# Patient Record
Sex: Male | Born: 1985 | Race: Black or African American | Hispanic: No | Marital: Single | Smoking: Never smoker
Health system: Southern US, Community
[De-identification: ages and names within clinical notes are randomized; demographics above are authoritative.]

## PROBLEM LIST (undated history)

## (undated) DIAGNOSIS — J45909 Unspecified asthma, uncomplicated: Secondary | ICD-10-CM

---

## 2008-01-02 ENCOUNTER — Emergency Department: Payer: Self-pay | Admitting: Emergency Medicine

## 2008-01-03 ENCOUNTER — Emergency Department (HOSPITAL_COMMUNITY): Admission: EM | Admit: 2008-01-03 | Discharge: 2008-01-03 | Payer: Self-pay | Admitting: Emergency Medicine

## 2010-05-23 ENCOUNTER — Emergency Department: Payer: Self-pay | Admitting: Emergency Medicine

## 2011-03-05 ENCOUNTER — Emergency Department: Payer: Self-pay | Admitting: Emergency Medicine

## 2013-02-18 ENCOUNTER — Emergency Department: Payer: Self-pay | Admitting: Emergency Medicine

## 2015-12-09 ENCOUNTER — Emergency Department
Admission: EM | Admit: 2015-12-09 | Discharge: 2015-12-09 | Disposition: A | Discharge status: Home / Self Care | Payer: BLUE CROSS/BLUE SHIELD | Attending: Emergency Medicine | Admitting: Emergency Medicine

## 2015-12-09 ENCOUNTER — Encounter: Payer: Self-pay | Admitting: Emergency Medicine

## 2015-12-09 DIAGNOSIS — L209 Atopic dermatitis, unspecified: Secondary | ICD-10-CM | POA: Diagnosis not present

## 2015-12-09 DIAGNOSIS — R350 Frequency of micturition: Secondary | ICD-10-CM | POA: Diagnosis present

## 2015-12-09 LAB — BASIC METABOLIC PANEL
Anion gap: 6 (ref 5–15)
BUN: 16 mg/dL (ref 6–20)
CHLORIDE: 101 mmol/L (ref 101–111)
CO2: 29 mmol/L (ref 22–32)
CREATININE: 1.06 mg/dL (ref 0.61–1.24)
Calcium: 9.5 mg/dL (ref 8.9–10.3)
GFR calc Af Amer: >60 mL/min (ref >60)
GFR calc non Af Amer: >60 mL/min (ref >60)
GLUCOSE: 95 mg/dL (ref 65–99)
POTASSIUM: 4 mmol/L (ref 3.5–5.1)
SODIUM: 136 mmol/L (ref 135–145)

## 2015-12-09 LAB — URINALYSIS COMPLETE WITH MICROSCOPIC (ARMC ONLY)
BACTERIA UA: NONE SEEN
BILIRUBIN URINE: NEGATIVE
GLUCOSE, UA: NEGATIVE mg/dL
HGB URINE DIPSTICK: NEGATIVE
KETONES UR: NEGATIVE mg/dL
LEUKOCYTES UA: NEGATIVE
NITRITE: NEGATIVE
Protein, ur: NEGATIVE mg/dL
SPECIFIC GRAVITY, URINE: 1.023 (ref 1.005–1.030)
Squamous Epithelial / LPF: NONE SEEN
pH: 5 (ref 5.0–8.0)

## 2015-12-09 LAB — CHLAMYDIA/NGC RT PCR (ARMC ONLY)
Chlamydia Tr: NOT DETECTED
N gonorrhoeae: NOT DETECTED

## 2015-12-09 LAB — RAPID HIV SCREEN (HIV 1/2 AB+AG)
HIV 1/2 ANTIBODIES: NONREACTIVE
HIV-1 P24 ANTIGEN - HIV24: NONREACTIVE

## 2015-12-09 LAB — CBC
HEMATOCRIT: 47.1 % (ref 40.0–52.0)
Hemoglobin: 16 g/dL (ref 13.0–18.0)
MCH: 31.3 pg (ref 26.0–34.0)
MCHC: 33.9 g/dL (ref 32.0–36.0)
MCV: 92.2 fL (ref 80.0–100.0)
PLATELETS: 189 10*3/uL (ref 150–440)
RBC: 5.11 MIL/uL (ref 4.40–5.90)
RDW: 12.6 % (ref 11.5–14.5)
WBC: 6.8 10*3/uL (ref 3.8–10.6)

## 2015-12-09 MED ORDER — TRIAMCINOLONE ACETONIDE 0.5 % EX OINT: 1.0000 "application " | TOPICAL_OINTMENT | Freq: Two times a day (BID) | CUTANEOUS | 0 refills | Status: DC

## 2015-12-09 NOTE — ED Triage Notes (Signed)
Has been feeling tired lately  Urinary freq w/o pain  Decreased appetite   Also states he want to bee checked for STD   Penile discharge but has had some dry skin  And feeling pressure

## 2015-12-09 NOTE — ED Notes (Signed)
Says frequent urination, fatigue for about 2 weeks and has headache sometimes.  Says he wants to be checked for diabetes.  Also a dry sore area in genital area.

## 2015-12-09 NOTE — ED Notes (Signed)
Also says elbows hurting with extension for months.

## 2015-12-13 NOTE — ED Provider Notes (Signed)
[facility] Emergency Department Provider Note    First MD Initiated Contact with Patient 12/09/15 1249     (approximate)  I have reviewed the triage vital signs and the nursing notes.   HISTORY  Chief Complaint Fatigue and Urinary Frequency    HPI Sean Wise is a 30 y.o. male presents to the emergency department with urinary frequency and requesting to be checked for sexually transmitted diseases. Patient states that he's had 4 sexual partners to see her which were unprotected intercourse. Patient denies any penile discharge no fever no nausea or vomiting or abdominal pain. Patient stated that he's noted areas of dry skin on his glans penis.  Past medical history Eczema There are no active problems to display for this patient.   Past surgical history None  Prior to Admission medications   Medication Sig Start Date End Date Taking? Authorizing Provider  triamcinolone ointment (KENALOG) 0.5 % Apply 1 application topically 2 (two) times daily. 12/09/15   Darci Currentandolph N Lerae Langham, MD    Allergies Chocolate; Peanut-containing drug products; and Tomato  No family history on file.  Social History Social History  Substance Use Topics  . Smoking status: Never Smoker  . Smokeless tobacco: Never Used  . Alcohol use No    Review of Systems Constitutional: No fever/chills Eyes: No visual changes. ENT: No sore throat. Cardiovascular: Denies chest pain. Respiratory: Denies shortness of breath. Gastrointestinal: No abdominal pain.  No nausea, no vomiting.  No diarrhea.  No constipation. Genitourinary: Negative for dysuria.Positive for "dry skin on penis" Musculoskeletal: Negative for back pain. Skin: Negative for rash. Neurological: Negative for headaches, focal weakness or numbness.  10-point ROS otherwise negative.  ____________________________________________   PHYSICAL EXAM:  VITAL SIGNS: ED Triage Vitals  Enc Vitals Group     BP 12/09/15  1128 129/82     Pulse Rate 12/09/15 1128 71     Resp 12/09/15 1128 20     Temp 12/09/15 1128 98.4 F (36.9 C)     Temp Source 12/09/15 1128 Oral     SpO2 12/09/15 1128 97 %     Weight 12/09/15 1125 190 lb (86.2 kg)     Height 12/09/15 1125 5\' 6"  (1.676 m)     Head Circumference --      Peak Flow --      Pain Score --      Pain Loc --      Pain Edu? --      Excl. in GC? --     Constitutional: Alert and oriented. Well appearing and in no acute distress. Eyes: Conjunctivae are normal. PERRL. EOMI. Head: Atraumatic. Mouth/Throat: Mucous membranes are moist.  Oropharynx non-erythematous. Neck: No stridor.  No meningeal signs.   Cardiovascular: Normal rate, regular rhythm. Good peripheral circulation. Grossly normal heart sounds. Respiratory: Normal respiratory effort.  No retractions. Lungs CTAB. Gastrointestinal: Soft and nontender. No distention.  Genitourinary: Dry skin noted on the glans penis Musculoskeletal: No lower extremity tenderness nor edema. No gross deformities of extremities. Neurologic:  Normal speech and language. No gross focal neurologic deficits are appreciated.  Skin:  Bilateral antecubital rash consistent with eczema Psychiatric: Mood and affect are normal. Speech and behavior are normal.  ____________________________________________   LABS (all labs ordered are listed, but only abnormal results are displayed)  Labs Reviewed  URINALYSIS COMPLETEWITH MICROSCOPIC ([facility] ONLY) - Abnormal; Notable for the following:       Result Value   Color, Urine YELLOW (*)    APPearance  CLEAR (*)    All other components within normal limits  CHLAMYDIA/NGC RT PCR ([facility] ONLY)  BASIC METABOLIC PANEL  CBC  RAPID HIV SCREEN (HIV 1/2 AB+AG)   _______ Procedures     INITIAL IMPRESSION / ASSESSMENT AND PLAN / ED COURSE  Pertinent labs & imaging results that were available during my care of the patient were reviewed by me and considered in my medical decision  making (see chart for details).     Clinical Course     ____________________________________________  FINAL CLINICAL IMPRESSION(S) / ED DIAGNOSES  Final diagnoses:  Atopic dermatitis, unspecified type     MEDICATIONS GIVEN DURING THIS VISIT:  Medications - No data to display   NEW OUTPATIENT MEDICATIONS STARTED DURING THIS VISIT:  Discharge Medication List as of 12/09/2015  1:49 PM    START taking these medications   Details  triamcinolone ointment (KENALOG) 0.5 % Apply 1 application topically 2 (two) times daily., Starting Mon 12/09/2015, Print        Discharge Medication List as of 12/09/2015  1:49 PM      Discharge Medication List as of 12/09/2015  1:49 PM       Note:  This document was prepared using Dragon voice recognition software and may include unintentional dictation errors.    Darci Currentandolph N Katielynn Horan, MD 12/13/15 2249

## 2015-12-24 ENCOUNTER — Encounter: Payer: Self-pay | Admitting: Emergency Medicine

## 2015-12-24 ENCOUNTER — Emergency Department
Admission: EM | Admit: 2015-12-24 | Discharge: 2015-12-24 | Disposition: A | Discharge status: Home / Self Care | Payer: BLUE CROSS/BLUE SHIELD | Attending: Emergency Medicine | Admitting: Emergency Medicine

## 2015-12-24 DIAGNOSIS — R509 Fever, unspecified: Secondary | ICD-10-CM | POA: Diagnosis present

## 2015-12-24 DIAGNOSIS — Z91010 Allergy to peanuts: Secondary | ICD-10-CM | POA: Insufficient documentation

## 2015-12-24 DIAGNOSIS — J029 Acute pharyngitis, unspecified: Secondary | ICD-10-CM | POA: Insufficient documentation

## 2015-12-24 LAB — POCT RAPID STREP A: Streptococcus, Group A Screen (Direct): NEGATIVE

## 2015-12-24 MED ORDER — MAGIC MOUTHWASH W/LIDOCAINE: 5.0000 mL | Freq: Three times a day (TID) | ORAL | 0 refills | Status: DC | PRN

## 2015-12-24 NOTE — ED Provider Notes (Signed)
[facility] Emergency Department Provider Note  ____________________________________________  Time seen: Approximately 4:22 PM  I have reviewed the triage vital signs and the nursing notes.   HISTORY  Chief Complaint Sore Throat    HPI Sean Wise is a 30 y.o. male that presents to the emergency department with a sore throat for 2 days. Patient states that over the weekend he had some chills. Patient states that he has had a fever but not sure how high. Patient states today that he is having difficulty swallowing secondary to pain. Patient is eating and drinking normally. Patient has been taking ibuprofen for pain, which is helping. Pain does not feel like throat is closing and patient is not having difficulty breathing.   History reviewed. No pertinent past medical history.  There are no active problems to display for this patient.   History reviewed. No pertinent surgical history.  Prior to Admission medications   Medication Sig Start Date End Date Taking? Authorizing Provider  magic mouthwash w/lidocaine SOLN Take 5 mLs by mouth 3 (three) times daily as needed for mouth pain. 12/24/15   Enid DerryAshley Gizzelle Lacomb, PA-C  triamcinolone ointment (KENALOG) 0.5 % Apply 1 application topically 2 (two) times daily. 12/09/15   Darci Currentandolph N Brown, MD    Allergies Chocolate; Peanut-containing drug products; and Tomato  No family history on file.  Social History Social History  Substance Use Topics  . Smoking status: Never Smoker  . Smokeless tobacco: Never Used  . Alcohol use No     Review of Systems  Eyes: No visual changes. No discharge ENT: No upper respiratory complaints. Cardiovascular: no chest pain. Respiratory: no cough. No SOB. Gastrointestinal: No abdominal pain.  No nausea, no vomiting.  Musculoskeletal: Negative for musculoskeletal pain. Skin: Negative for rash, abrasions, lacerations, ecchymosis. Neurological: Negative for  headaches.  ____________________________________________   PHYSICAL EXAM:  VITAL SIGNS: ED Triage Vitals  Enc Vitals Group     BP 12/24/15 1541 131/80     Pulse Rate 12/24/15 1541 86     Resp 12/24/15 1541 18     Temp 12/24/15 1541 98.2 F (36.8 C)     Temp Source 12/24/15 1541 Oral     SpO2 12/24/15 1541 97 %     Weight 12/24/15 1542 190 lb (86.2 kg)     Height 12/24/15 1542 5\' 6"  (1.676 m)     Head Circumference --      Peak Flow --      Pain Score 12/24/15 1542 2     Pain Loc --      Pain Edu? --      Excl. in GC? --      Constitutional: Alert and oriented. Well appearing and in no acute distress. Eyes: Conjunctivae are normal. PERRL. EOMI. Head: Atraumatic. ENT:       Ears: Tympanic membranes pearly grey with good landmarks.       Nose: No congestion/rhinnorhea.      Mouth/Throat: Mucous membranes are moist. Oropharynx erythematous. Tonsils enlarged bilaterally. No exudates present. Uvula midline. Neck: No stridor.   Hematological/Lymphatic/Immunilogical: Anterior cervical lymphadenopathy. Cardiovascular: Normal rate, regular rhythm. Normal S1 and S2.  Good peripheral circulation. Respiratory: Normal respiratory effort without tachypnea or retractions. Lungs CTAB. Good air entry to the bases with no decreased or absent breath sounds. Musculoskeletal: Full range of motion to all extremities. No gross deformities appreciated. Neurologic:  Normal speech and language. No gross focal neurologic deficits are appreciated.  Skin:  Skin is warm, dry and  intact. No rash noted. Psychiatric: Mood and affect are normal. Speech and behavior are normal. Patient exhibits appropriate insight and judgement.   ____________________________________________   LABS (all labs ordered are listed, but only abnormal results are displayed)  Labs Reviewed  CULTURE, GROUP A STREP [facility]  POCT RAPID STREP A    ____________________________________________  EKG   ____________________________________________  RADIOLOGY  No results found.  ____________________________________________    PROCEDURES  Procedure(s) performed:    Procedures    Medications - No data to display   ____________________________________________   INITIAL IMPRESSION / ASSESSMENT AND PLAN / ED COURSE  Pertinent labs & imaging results that were available during my care of the patient were reviewed by me and considered in my medical decision making (see chart for details).  Review of the [place] CSRS was performed in accordance of the [facility] prior to dispensing any controlled drugs.  Clinical Course     Patient's diagnosis is consistent with Viral pharyngitis. Strep was negative. Uvula is midline and no abscess is seen. Patient feels pain in his throat and does not feel like his throat is closing. Patient is breathing normally. HIV screen was done 2 weeks ago. Vital signs are reassuring. Patient will be discharged home with prescriptions for magic mouthwash. Patient is to follow up with PCP as needed or otherwise directed. Patient is given ED precautions to return to the ED for any worsening or new symptoms.     ____________________________________________  FINAL CLINICAL IMPRESSION(S) / ED DIAGNOSES  Final diagnoses:  Viral pharyngitis      NEW MEDICATIONS STARTED DURING THIS VISIT:  Discharge Medication List as of 12/24/2015  5:01 PM    START taking these medications   Details  magic mouthwash w/lidocaine SOLN Take 5 mLs by mouth 3 (three) times daily as needed for mouth pain., Starting Tue 12/24/2015, Print            This chart was dictated using voice recognition software/Dragon. Despite best efforts to proofread, errors can occur which can change the meaning. Any change was purely unintentional.   Enid DerryAshley Daquann Merriott, PA-C 12/24/15 1907    Sharman CheekPhillip Stafford, MD 12/24/15 2351

## 2015-12-24 NOTE — ED Triage Notes (Signed)
Patient presents to the ED with sore throat x 2 days.  Patient is in no obvious distress at this time.  Patient states 800mg  ibuprofen relieves pain.

## 2015-12-27 LAB — CULTURE, GROUP A STREP (THRC)

## 2015-12-30 ENCOUNTER — Emergency Department: Payer: BLUE CROSS/BLUE SHIELD

## 2015-12-30 ENCOUNTER — Encounter: Payer: Self-pay | Admitting: Emergency Medicine

## 2015-12-30 ENCOUNTER — Emergency Department
Admission: EM | Admit: 2015-12-30 | Discharge: 2015-12-30 | Disposition: A | Discharge status: Home / Self Care | Payer: BLUE CROSS/BLUE SHIELD | Attending: Emergency Medicine | Admitting: Emergency Medicine

## 2015-12-30 DIAGNOSIS — R070 Pain in throat: Secondary | ICD-10-CM | POA: Diagnosis present

## 2015-12-30 DIAGNOSIS — J36 Peritonsillar abscess: Secondary | ICD-10-CM | POA: Insufficient documentation

## 2015-12-30 DIAGNOSIS — J45909 Unspecified asthma, uncomplicated: Secondary | ICD-10-CM | POA: Insufficient documentation

## 2015-12-30 HISTORY — DX: Unspecified asthma, uncomplicated: J45.909

## 2015-12-30 LAB — CBC WITH DIFFERENTIAL/PLATELET
BASOS PCT: 0 %
Basophils Absolute: 0 10*3/uL (ref 0–0.1)
EOS ABS: 0.2 10*3/uL (ref 0–0.7)
EOS PCT: 2 %
HEMATOCRIT: 44.1 % (ref 40.0–52.0)
Hemoglobin: 15.1 g/dL (ref 13.0–18.0)
Lymphocytes Relative: 21 %
Lymphs Abs: 2.1 10*3/uL (ref 1.0–3.6)
MCH: 31.2 pg (ref 26.0–34.0)
MCHC: 34.2 g/dL (ref 32.0–36.0)
MCV: 91.2 fL (ref 80.0–100.0)
MONO ABS: 0.8 10*3/uL (ref 0.2–1.0)
MONOS PCT: 8 %
NEUTROS ABS: 7 10*3/uL — AB (ref 1.4–6.5)
Neutrophils Relative %: 69 %
Platelets: 272 10*3/uL (ref 150–440)
RBC: 4.84 MIL/uL (ref 4.40–5.90)
RDW: 12.4 % (ref 11.5–14.5)
WBC: 10.2 10*3/uL (ref 3.8–10.6)

## 2015-12-30 LAB — BASIC METABOLIC PANEL
Anion gap: 6 (ref 5–15)
BUN: 12 mg/dL (ref 6–20)
CALCIUM: 9.3 mg/dL (ref 8.9–10.3)
CO2: 28 mmol/L (ref 22–32)
CREATININE: 1.01 mg/dL (ref 0.61–1.24)
Chloride: 101 mmol/L (ref 101–111)
GFR calc Af Amer: >60 mL/min (ref >60)
GFR calc non Af Amer: >60 mL/min (ref >60)
GLUCOSE: 93 mg/dL (ref 65–99)
Potassium: 3.9 mmol/L (ref 3.5–5.1)
Sodium: 135 mmol/L (ref 135–145)

## 2015-12-30 LAB — MONONUCLEOSIS SCREEN: Mono Screen: NEGATIVE

## 2015-12-30 MED ORDER — CLINDAMYCIN PHOSPHATE 600 MG/50ML IV SOLN
600.0000 mg | Freq: Once | INTRAVENOUS | Status: AC
  Administered 2015-12-30: 600 mg via INTRAVENOUS
  Filled 2015-12-30: qty 50

## 2015-12-30 MED ORDER — CLINDAMYCIN HCL 300 MG PO CAPS: 300.0000 mg | ORAL_CAPSULE | Freq: Four times a day (QID) | ORAL | 0 refills | Status: DC

## 2015-12-30 MED ORDER — KETOROLAC TROMETHAMINE 30 MG/ML IJ SOLN
30.0000 mg | Freq: Once | INTRAMUSCULAR | Status: AC
  Administered 2015-12-30: 30 mg via INTRAVENOUS
  Filled 2015-12-30: qty 1

## 2015-12-30 MED ORDER — IOPAMIDOL (ISOVUE-300) INJECTION 61%
75.0000 mL | Freq: Once | INTRAVENOUS | Status: AC | PRN
  Administered 2015-12-30: 75 mL via INTRAVENOUS
  Filled 2015-12-30: qty 75

## 2015-12-30 MED ORDER — DEXAMETHASONE SODIUM PHOSPHATE 10 MG/ML IJ SOLN
10.0000 mg | Freq: Once | INTRAMUSCULAR | Status: AC
  Administered 2015-12-30: 10 mg via INTRAVENOUS
  Filled 2015-12-30: qty 1

## 2015-12-30 MED ORDER — HYDROCODONE-ACETAMINOPHEN 5-325 MG PO TABS: 1.0000 | ORAL_TABLET | Freq: Four times a day (QID) | ORAL | 0 refills | Status: DC | PRN

## 2015-12-30 MED ORDER — PREDNISONE 10 MG PO TABS: 10.0000 mg | ORAL_TABLET | Freq: Two times a day (BID) | ORAL | 0 refills | Status: DC

## 2015-12-30 NOTE — Discharge Instructions (Signed)
Take the antibiotic as directed. Take the steroid as directed. Take the pain medicine as needed. Follow-up wit Dr. Elenore RotaJuengel this week for re-evaluation. Return to the ED for severely worsening symptoms.

## 2015-12-30 NOTE — ED Provider Notes (Addendum)
[facility] Emergency Department Provider Note ____________________________________________  Time seen: 1135  I have reviewed the triage vital signs and the nursing notes.  HISTORY  Chief Complaint  Torticollis; Otalgia; and Sore Throat  HPI  Sean Wise is a 30 y.o. male returns to the ED for evaluation of continued right-sided sore throat pain and throat fullness. He was evaluated last week and diagnosed with a likely viral etiology for his sore throat and tonsillitis. His rapid strep and throat culture subsequently been reported as negative. Patient continues to report pain and discomfort in the throat noting now the symptoms have gone from being a bilateral to primarily on the right side. He has pain and fullness that radiates into the right ear as well. He denies any fevers in the interim. He is unable to control his oral secretions and able to eat and drink with some discomfort. Denies any previous history of peritonsillar abscess.  Past Medical History:  Diagnosis Date  . Asthma     There are no active problems to display for this patient.  History reviewed. No pertinent surgical history.  Prior to Admission medications   Medication Sig Start Date End Date Taking? Authorizing Provider  clindamycin (CLEOCIN) 300 MG capsule Take 1 capsule (300 mg total) by mouth 4 (four) times daily. 12/30/15   Leonard Feigel V Bacon Cinsere Mizrahi, PA-C  HYDROcodone-acetaminophen (NORCO) 5-325 MG tablet Take 1 tablet by mouth every 6 (six) hours as needed. 12/30/15   Jasslyn Finkel V Bacon Mirelle Biskup, PA-C  magic mouthwash w/lidocaine SOLN Take 5 mLs by mouth 3 (three) times daily as needed for mouth pain. 12/24/15   Enid DerryAshley Wagner, PA-C  predniSONE (DELTASONE) 10 MG tablet Take 1 tablet (10 mg total) by mouth 2 (two) times daily with a meal. 12/30/15   Coty Student V Bacon Rheba Diamond, PA-C  triamcinolone ointment (KENALOG) 0.5 % Apply 1 application topically 2 (two) times daily. 12/09/15   Darci Currentandolph N Brown,  MD   Allergies Chocolate; Peanut-containing drug products; and Tomato  No family history on file.  Social History Social History  Substance Use Topics  . Smoking status: Never Smoker  . Smokeless tobacco: Never Used  . Alcohol use No   Review of Systems  Constitutional: Negative for fever. Eyes: Negative for visual changes. ENT: Positive for sore throat. Cardiovascular: Negative for chest pain. Respiratory: Negative for shortness of breath. Gastrointestinal: Negative for abdominal pain, vomiting and diarrhea. Musculoskeletal: Negative for back pain. Reports neck pain on the right Neurological: Negative for headaches, focal weakness or numbness. ____________________________________________  PHYSICAL EXAM:  VITAL SIGNS: ED Triage Vitals  Enc Vitals Group     BP 12/30/15 1120 116/73     Pulse Rate 12/30/15 1120 96     Resp --      Temp 12/30/15 1120 98.6 F (37 C)     Temp Source 12/30/15 1120 Oral     SpO2 12/30/15 1120 99 %     Weight 12/30/15 1116 190 lb (86.2 kg)     Height 12/30/15 1116 5\' 6"  (1.676 m)     Head Circumference --      Peak Flow --      Pain Score 12/30/15 1116 9     Pain Loc --      Pain Edu? --      Excl. in GC? --    Constitutional: Alert and oriented. Well appearing and in no distress.  Head: Normocephalic and atraumatic. Eyes: Conjunctivae are normal. PERRL. Normal extraocular movements Ears: Canals clear.  TMs intact bilaterally. Right ear pain.  Nose: No congestion/rhinorrhea/epistaxis. Mouth/Throat: Mucous membranes are moist. Uvula is midline and right tonsil is erythematous, edematous, and exudative. There is significant prominence of the right tonsillar pillar. Patient is controlling oral secretions. Normal voice Neck: Supple. No thyromegaly. Trismus noted.  Hematological/Lymphatic/Immunological: Palpable right cervical lymphadenopathy. Cardiovascular: Normal rate, regular rhythm. Normal distal pulses. Respiratory: Normal respiratory  effort. No wheezes/rales/rhonchi. Musculoskeletal: Nontender with normal range of motion in all extremities.  Neurologic:  Normal gait without ataxia. Normal speech and language. No gross focal neurologic deficits are appreciated. ____________________________________________   LABS (pertinent positives/negatives) Labs Reviewed  CBC WITH DIFFERENTIAL/PLATELET - Abnormal; Notable for the following:       Result Value   Neutro Abs 7.0 (*)    All other components within normal limits  BASIC METABOLIC PANEL  MONONUCLEOSIS SCREEN  ____________________________________________   RADIOLOGY  CT Neck w/ contrast IMPRESSION: Right peritonsillar fluid collection measuring 18 x 14 mm compatible with abscess. Reactive adenopathy in the cervical lymph nodes. ____________________________________________  PROCEDURES  Decadron 10 mg IVP Toradol 30 mg IVP Clindamycin 600 mg IVP ____________________________________________  INITIAL IMPRESSION / ASSESSMENT AND PLAN / ED COURSE  Patient with an acute right peritonsillar abscess without uvular shift. Patient stable on presentation without indication of acute airway obstruction. He is stable for outpatient management and as such will be discharged with prescription for clindamycin, prednisone, and hydrocodone. He reports improvement of his symptoms following his ED course. He will follow up with Dr. Elenore RotaJuengel for further evaluation and 3-5 days.  Clinical Course    ----------------------------------------- 4:33 PM on 12/30/2015 ----------------------------------------- Spoke with Dr. Elenore RotaJuengel several hours after patient's discharge. He agreed with management provided in the ED including steroids, pain medicine, and antibiotics. He agrees to see the patient in the office given his stable ED presentation and course.  ____________________________________________  FINAL CLINICAL IMPRESSION(S) / ED DIAGNOSES  Final diagnoses:  Peritonsillar abscess       Lissa HoardJenise V Bacon Henson Fraticelli, PA-C 12/30/15 1439    Charlynne Panderavid Hsienta Yao, MD 01/01/16 [address]1204    Karry Causer V Bacon MatamorasMenshew, PA-C 01/09/16 1535

## 2015-12-30 NOTE — ED Triage Notes (Signed)
Recently dx'd with mono  Developed pain to right side neck and ear pain couple of days ago

## 2015-12-30 NOTE — ED Notes (Signed)
States he was recently dx'd with mono  Last Thursday he developed pain with some swollen glands to right side of neck and pain into right ear

## 2015-12-31 ENCOUNTER — Telehealth: Payer: Self-pay | Admitting: Emergency Medicine

## 2015-12-31 NOTE — Telephone Encounter (Signed)
Called patient at request of dr Silverio Layyao to assure ent follow up of peri tonsilar abscess.  Per [facility], have patient come in tomorrow at 9am.  Patient says he will be there.

## 2017-03-31 ENCOUNTER — Other Ambulatory Visit: Payer: Self-pay | Admitting: Occupational Medicine

## 2017-03-31 ENCOUNTER — Ambulatory Visit: Payer: Self-pay

## 2017-03-31 DIAGNOSIS — Z0000 Encounter for general adult medical examination without abnormal findings: Secondary | ICD-10-CM

## 2017-06-18 ENCOUNTER — Other Ambulatory Visit: Payer: Self-pay

## 2017-06-18 ENCOUNTER — Emergency Department
Admission: EM | Admit: 2017-06-18 | Discharge: 2017-06-18 | Disposition: A | Discharge status: Home / Self Care | Payer: BLUE CROSS/BLUE SHIELD | Attending: Emergency Medicine | Admitting: Emergency Medicine

## 2017-06-18 ENCOUNTER — Encounter: Payer: Self-pay | Admitting: Emergency Medicine

## 2017-06-18 DIAGNOSIS — H1033 Unspecified acute conjunctivitis, bilateral: Secondary | ICD-10-CM | POA: Insufficient documentation

## 2017-06-18 DIAGNOSIS — Z79899 Other long term (current) drug therapy: Secondary | ICD-10-CM | POA: Insufficient documentation

## 2017-06-18 DIAGNOSIS — Z91010 Allergy to peanuts: Secondary | ICD-10-CM | POA: Insufficient documentation

## 2017-06-18 DIAGNOSIS — J45909 Unspecified asthma, uncomplicated: Secondary | ICD-10-CM | POA: Insufficient documentation

## 2017-06-18 MED ORDER — ERYTHROMYCIN 5 MG/GM OP OINT: TOPICAL_OINTMENT | Freq: Three times a day (TID) | OPHTHALMIC | 0 refills | Status: AC

## 2017-06-18 MED ORDER — TETRACAINE HCL 0.5 % OP SOLN
2.0000 [drp] | Freq: Once | OPHTHALMIC | Status: AC
  Administered 2017-06-18: 2 [drp] via OPHTHALMIC
  Filled 2017-06-18: qty 4

## 2017-06-18 MED ORDER — FLUORESCEIN SODIUM 1 MG OP STRP
1.0000 | ORAL_STRIP | Freq: Once | OPHTHALMIC | Status: AC
  Administered 2017-06-18: 1 via OPHTHALMIC
  Filled 2017-06-18: qty 1

## 2017-06-18 MED ORDER — PREDNISOLONE ACETATE 0.12 % OP SUSP: 1.0000 [drp] | Freq: Four times a day (QID) | OPHTHALMIC | 0 refills | Status: DC

## 2017-06-18 NOTE — ED Provider Notes (Signed)
[facility] Emergency Department Provider Note  ____________________________________________  Time seen: Approximately 11:03 AM  I have reviewed the triage vital signs and the nursing notes.   HISTORY  Chief Complaint Eye Drainage    HPI Sean Wise is a 32 y.o. male that presents to the emergency department for evaluation of bilateral eye irritation and drainage for 2 days. He was at work on Wednesday when he got methanol powder in his eyes, more in the right eye. He used the eye washing station shortly after incident. He used visine and showered later that night.  He has had yellow drainage from both eyes since. Symptoms are worse in the right eye. He has had moderate right eye pain since and mild left eye pain.  He does not wear contacts. He states that he has poor vision already and is due to see the eye doctor for new prescriptions. He states that his vision has not changed from baseline. He does not have any systemic illnesses. He is not having difficulty keeping eyes open.  No fever, chills, foreign body sensation, blurry vision, double vision, photophobia. floaters, flashers, nausea, vomiting.   Past Medical History:  Diagnosis Date  . Asthma     There are no active problems to display for this patient.   History reviewed. No pertinent surgical history.  Prior to Admission medications   Medication Sig Start Date End Date Taking? Authorizing Provider  clindamycin (CLEOCIN) 300 MG capsule Take 1 capsule (300 mg total) by mouth 4 (four) times daily. 12/30/15   Menshew, Charlesetta Ivory, PA-C  erythromycin [facility]) ophthalmic ointment Place into the right eye 3 (three) times daily for 10 days. Place a 1/2 inch ribbon of ointment into the lower eyelid. 06/18/17 06/28/17  Enid Derry, PA-C  HYDROcodone-acetaminophen (NORCO) 5-325 MG tablet Take 1 tablet by mouth every 6 (six) hours as needed. 12/30/15   Menshew, Charlesetta Ivory, PA-C  magic mouthwash  w/lidocaine SOLN Take 5 mLs by mouth 3 (three) times daily as needed for mouth pain. 12/24/15   Enid Derry, PA-C  prednisoLONE acetate (PRED MILD) 0.12 % ophthalmic suspension Place 1 drop into both eyes 4 (four) times daily. 06/18/17   Enid Derry, PA-C  predniSONE (DELTASONE) 10 MG tablet Take 1 tablet (10 mg total) by mouth 2 (two) times daily with a meal. 12/30/15   Menshew, Charlesetta Ivory, PA-C  triamcinolone ointment (KENALOG) 0.5 % Apply 1 application topically 2 (two) times daily. 12/09/15   Darci Current, MD    Allergies Chocolate; Peanut-containing drug products; and Tomato  No family history on file.  Social History Social History   Tobacco Use  . Smoking status: Never Smoker  . Smokeless tobacco: Never Used  Substance Use Topics  . Alcohol use: No  . Drug use: Not on file     Review of Systems  Constitutional: No fever/chills Cardiovascular: No chest pain. Respiratory:  No SOB. Gastrointestinal: No nausea, no vomiting.  Skin: Negative for rash, abrasions, lacerations, ecchymosis.   ____________________________________________   PHYSICAL EXAM:  VITAL SIGNS: ED Triage Vitals  Enc Vitals Group     BP 06/18/17 1010 122/63     Pulse Rate 06/18/17 1010 (!) 105     Resp 06/18/17 1010 16     Temp 06/18/17 1010 99 F (37.2 C)     Temp Source 06/18/17 1010 Oral     SpO2 06/18/17 1010 96 %     Weight 06/18/17 1008 205 lb (93 kg)  Height 06/18/17 1008 5\' 7"  (1.702 m)     Head Circumference --      Peak Flow --      Pain Score 06/18/17 1007 8     Pain Loc --      Pain Edu? --      Excl. in GC? --      Constitutional: Alert and oriented. Well appearing and in no acute distress. Eyes: Eyes are injected diffusely bilaterally. No cillilary flush. Yellow drainage to bilateral eyelashes. PERRL. EOMI. No defect on fluorescein stain of right cornea. No surrounding erythema or swelling.  Head: Atraumatic. ENT:      Ears:      Nose: No  congestion/rhinnorhea.      Mouth/Throat: Mucous membranes are moist.  Neck: No stridor.  Cardiovascular: Normal rate, regular rhythm.  Good peripheral circulation. Respiratory: Normal respiratory effort without tachypnea or retractions. Lungs CTAB. Good air entry to the bases with no decreased or absent breath sounds. Musculoskeletal: Full range of motion to all extremities. No gross deformities appreciated. Neurologic:  Normal speech and language. No gross focal neurologic deficits are appreciated.  Skin:  Skin is warm, dry and intact. No rash noted.   ____________________________________________   LABS (all labs ordered are listed, but only abnormal results are displayed)  Labs Reviewed - No data to display ____________________________________________  EKG   ____________________________________________  RADIOLOGY  No results found.  ____________________________________________    PROCEDURES  Procedure(s) performed:    Procedures    Medications  fluorescein ophthalmic strip 1 strip (1 strip Right Eye Given 06/18/17 1155)  tetracaine (PONTOCAINE) 0.5 % ophthalmic solution 2 drop (2 drops Right Eye Given by Other 06/18/17 1155)     ____________________________________________   INITIAL IMPRESSION / ASSESSMENT AND PLAN / ED COURSE  Pertinent labs & imaging results that were available during my care of the patient were reviewed by me and considered in my medical decision making (see chart for details).  Review of the [place] CSRS was performed in accordance of the [facility] prior to dispensing any controlled drugs.   Patient's diagnosis is consistent with conjunctivitis. Vital signs and exam are reassuring. No indication for hordeolum, dacrocystitis, cellulitis, subconjunctival hemorrhage, corneal abrasion, corneal ulcer, hyphema, hypopyon, retinal detachment, uveitis, glaucoma. Patient will be discharged home with prescriptions for erythromycin and prednisolone acetate.  Patient is to follow up with opthamology on Monday. Patient is given ED precautions to return to the ED for any worsening or new symptoms. He is agreeable to return to the Emergency department tomorrow if symptoms worsen.     ____________________________________________  FINAL CLINICAL IMPRESSION(S) / ED DIAGNOSES  Final diagnoses:  Acute conjunctivitis of both eyes, unspecified acute conjunctivitis type      NEW MEDICATIONS STARTED DURING THIS VISIT:  ED Discharge Orders        Ordered    erythromycin [facility](ROMYCIN) ophthalmic ointment  3 times daily     06/18/17 1145    prednisoLONE acetate (PRED MILD) 0.12 % ophthalmic suspension  4 times daily     06/18/17 1145          This chart was dictated using voice recognition software/Dragon. Despite best efforts to proofread, errors can occur which can change the meaning. Any change was purely unintentional.    Enid DerryWagner, Dema Timmons, PA-C 06/18/17 1527    Arnaldo NatalMalinda, Paul F, MD 06/18/17 [id number]

## 2017-06-18 NOTE — Discharge Instructions (Addendum)
Return to the emergency department tomorrow if symptoms do not improve with antibiotics and steroids.  Follow-up with ophthalmology on Monday.

## 2017-06-18 NOTE — ED Triage Notes (Signed)
Pt to ED via POV c/o bilateral eye pain and drainage. Pt also states that his lymph nodes are swollen. Pt is in NAD a this time.

## 2017-06-18 NOTE — ED Notes (Signed)
See triage note  Presents with irritation to both eyes     States he is having pain to right eye  Unsure if he got anything in his eyes last pm at work  But also had temporal pain when washing his face

## 2017-08-13 ENCOUNTER — Ambulatory Visit
Admission: RE | Admit: 2017-08-13 | Discharge: 2017-08-13 | Disposition: A | Discharge status: Home / Self Care | Payer: No Typology Code available for payment source | Source: Ambulatory Visit | Attending: Occupational Medicine | Admitting: Occupational Medicine

## 2017-08-13 ENCOUNTER — Other Ambulatory Visit: Payer: Self-pay | Admitting: Occupational Medicine

## 2017-08-13 DIAGNOSIS — Z021 Encounter for pre-employment examination: Secondary | ICD-10-CM

## 2018-01-28 ENCOUNTER — Emergency Department
Admission: EM | Admit: 2018-01-28 | Discharge: 2018-01-28 | Disposition: A | Discharge status: Home / Self Care | Payer: BLUE CROSS/BLUE SHIELD | Attending: Emergency Medicine | Admitting: Emergency Medicine

## 2018-01-28 ENCOUNTER — Other Ambulatory Visit: Payer: Self-pay

## 2018-01-28 DIAGNOSIS — J45909 Unspecified asthma, uncomplicated: Secondary | ICD-10-CM | POA: Insufficient documentation

## 2018-01-28 DIAGNOSIS — Z91010 Allergy to peanuts: Secondary | ICD-10-CM | POA: Diagnosis not present

## 2018-01-28 DIAGNOSIS — J020 Streptococcal pharyngitis: Secondary | ICD-10-CM

## 2018-01-28 DIAGNOSIS — M545 Low back pain: Secondary | ICD-10-CM | POA: Diagnosis present

## 2018-01-28 DIAGNOSIS — J101 Influenza due to other identified influenza virus with other respiratory manifestations: Secondary | ICD-10-CM

## 2018-01-28 LAB — CBC WITH DIFFERENTIAL/PLATELET
Abs Immature Granulocytes: 0.03 10*3/uL (ref 0.00–0.07)
BASOS ABS: 0 10*3/uL (ref 0.0–0.1)
BASOS PCT: 0 %
EOS ABS: 0 10*3/uL (ref 0.0–0.5)
EOS PCT: 0 %
HEMATOCRIT: 42.3 % (ref 39.0–52.0)
Hemoglobin: 13.9 g/dL (ref 13.0–17.0)
Immature Granulocytes: 0 %
Lymphocytes Relative: 16 %
Lymphs Abs: 1.3 10*3/uL (ref 0.7–4.0)
MCH: 30 pg (ref 26.0–34.0)
MCHC: 32.9 g/dL (ref 30.0–36.0)
MCV: 91.4 fL (ref 80.0–100.0)
Monocytes Absolute: 0.8 10*3/uL (ref 0.1–1.0)
Monocytes Relative: 10 %
NRBC: 0 % (ref 0.0–0.2)
Neutro Abs: 6 10*3/uL (ref 1.7–7.7)
Neutrophils Relative %: 74 %
PLATELETS: 170 10*3/uL (ref 150–400)
RBC: 4.63 MIL/uL (ref 4.22–5.81)
RDW: 13.2 % (ref 11.5–15.5)
WBC: 8.3 10*3/uL (ref 4.0–10.5)

## 2018-01-28 LAB — URINALYSIS, COMPLETE (UACMP) WITH MICROSCOPIC
Bacteria, UA: NONE SEEN
Bilirubin Urine: NEGATIVE
Glucose, UA: NEGATIVE mg/dL
Hgb urine dipstick: NEGATIVE
Ketones, ur: 5 mg/dL — AB
Leukocytes, UA: NEGATIVE
NITRITE: NEGATIVE
Protein, ur: NEGATIVE mg/dL
Specific Gravity, Urine: 1.017 (ref 1.005–1.030)
pH: 5 (ref 5.0–8.0)

## 2018-01-28 LAB — INFLUENZA PANEL BY PCR (TYPE A & B)
INFLAPCR: NEGATIVE
Influenza B By PCR: POSITIVE — AB

## 2018-01-28 LAB — COMPREHENSIVE METABOLIC PANEL
ALK PHOS: 48 U/L (ref 38–126)
ALT: 49 U/L — AB (ref 0–44)
ANION GAP: 8 (ref 5–15)
AST: 37 U/L (ref 15–41)
Albumin: 4.1 g/dL (ref 3.5–5.0)
BUN: 16 mg/dL (ref 6–20)
CALCIUM: 8.7 mg/dL — AB (ref 8.9–10.3)
CO2: 26 mmol/L (ref 22–32)
Chloride: 102 mmol/L (ref 98–111)
Creatinine, Ser: 1.36 mg/dL — ABNORMAL HIGH (ref 0.61–1.24)
Glucose, Bld: 103 mg/dL — ABNORMAL HIGH (ref 70–99)
Potassium: 3.7 mmol/L (ref 3.5–5.1)
SODIUM: 136 mmol/L (ref 135–145)
TOTAL PROTEIN: 8 g/dL (ref 6.5–8.1)
Total Bilirubin: 0.8 mg/dL (ref 0.3–1.2)

## 2018-01-28 LAB — GROUP A STREP BY PCR: Group A Strep by PCR: DETECTED — AB

## 2018-01-28 MED ORDER — HYDROCODONE-ACETAMINOPHEN 5-325 MG PO TABS: 1.0000 | ORAL_TABLET | Freq: Four times a day (QID) | ORAL | 0 refills | Status: DC | PRN

## 2018-01-28 MED ORDER — AMOXICILLIN 500 MG PO TABS: 500.0000 mg | ORAL_TABLET | Freq: Three times a day (TID) | ORAL | 0 refills | Status: DC

## 2018-01-28 MED ORDER — PREDNISONE 10 MG (21) PO TBPK: ORAL_TABLET | ORAL | 0 refills | Status: DC

## 2018-01-28 MED ORDER — SODIUM CHLORIDE 0.9 % IV BOLUS
1000.0000 mL | Freq: Once | INTRAVENOUS | Status: AC
  Administered 2018-01-28: 1000 mL via INTRAVENOUS

## 2018-01-28 MED ORDER — ACETAMINOPHEN 500 MG PO TABS
1000.0000 mg | ORAL_TABLET | Freq: Once | ORAL | Status: AC
  Administered 2018-01-28: 1000 mg via ORAL
  Filled 2018-01-28: qty 2

## 2018-01-28 MED ORDER — HYDROCODONE-ACETAMINOPHEN 5-325 MG PO TABS: 1.0000 | ORAL_TABLET | Freq: Four times a day (QID) | ORAL | 0 refills | Status: AC | PRN

## 2018-01-28 NOTE — ED Notes (Signed)
Pt reminded urine sample needed

## 2018-01-28 NOTE — ED Triage Notes (Signed)
Pt c/o back pain x2 months - the pain radiates down right leg - denies injury Pt c/o "flu" - cold chills, cough, runny nose, body aches

## 2018-01-28 NOTE — ED Notes (Signed)
Pt c/o flu like symptoms with body aches  C/O lower back pain that has been present for months and a friend told him it was sciatic pain because it radiates down right leg

## 2018-01-28 NOTE — Discharge Instructions (Signed)
Please follow-up with the primary care provider of your choice for symptoms that are not improving over the next few days.  Return to the emergency department for symptoms of change or worsen if unable to schedule an appointment.

## 2018-01-28 NOTE — ED Provider Notes (Signed)
[facility] Provider Note ___________________________________________   First MD Initiated Contact with Patient 01/28/18 1643     (approximate)  I have reviewed the triage vital signs and the nursing notes.   HISTORY  Chief Complaint Back Pain and Influenza  HPI Micha Vanvliet is a 33 y.o. male who presents to the emergency department for treatment and evaluation of back pain and flu like symptoms for 2 days as well as eczema flare. He complains of fever, chills, decreased appetite without vomiting, diarrhea, dizziness, or paresthesias. No relief with tussin.   Past Medical History:  Diagnosis Date  . Asthma     There are no active problems to display for this patient.   History reviewed. No pertinent surgical history.  Prior to Admission medications   Medication Sig Start Date End Date Taking? Authorizing Provider  amoxicillin (AMOXIL) 500 MG tablet Take 1 tablet (500 mg total) by mouth 3 (three) times daily. 01/28/18   Keionna Kinnaird, Rulon Eisenmenger B, FNP  clindamycin (CLEOCIN) 300 MG capsule Take 1 capsule (300 mg total) by mouth 4 (four) times daily. 12/30/15   Menshew, Charlesetta Ivory, PA-C  HYDROcodone-acetaminophen (NORCO/VICODIN) 5-325 MG tablet Take 1 tablet by mouth every 6 (six) hours as needed for up to 3 days for severe pain. 01/28/18 01/31/18  Dub Maclellan, Kasandra Knudsen, FNP  magic mouthwash w/lidocaine SOLN Take 5 mLs by mouth 3 (three) times daily as needed for mouth pain. 12/24/15   Enid Derry, PA-C  prednisoLONE acetate (PRED MILD) 0.12 % ophthalmic suspension Place 1 drop into both eyes 4 (four) times daily. 06/18/17   Enid Derry, PA-C  predniSONE (STERAPRED UNI-PAK 21 TAB) 10 MG (21) TBPK tablet Take 6 tablets on the first day and decrease by 1 tablet each day until finished. 01/28/18   Kenroy Timberman, Rulon Eisenmenger B, FNP  triamcinolone ointment (KENALOG) 0.5 % Apply 1 application topically 2 (two) times daily. 12/09/15   Darci Current, MD     Allergies Chocolate; Peanut-containing drug products; and Tomato  No family history on file.  Social History Social History   Tobacco Use  . Smoking status: Never Smoker  . Smokeless tobacco: Never Used  Substance Use Topics  . Alcohol use: No  . Drug use: Never    Review of Systems  Constitutional: Positive for fever/chills Eyes: No visual changes. ENT: No sore throat. Positive for sinus pressure. Cardiovascular: Denies chest pain. Respiratory: Denies shortness of breath. Positive for cough. Gastrointestinal: No abdominal pain.  No nausea, no vomiting.  No diarrhea.  No constipation. Genitourinary: Negative for dysuria. Musculoskeletal: Positive for back pain. Skin: Negative for rash. Neurological: Negative for headaches, focal weakness or numbness. No loss of bowel or bladder control or change in habits. ____________________________________________   PHYSICAL EXAM:  VITAL SIGNS: ED Triage Vitals  Enc Vitals Group     BP 01/28/18 1626 94/60     Pulse Rate 01/28/18 1626 (!) 110     Resp 01/28/18 1626 15     Temp 01/28/18 1626 (!) 100.6 F (38.1 C)     Temp Source 01/28/18 1626 Oral     SpO2 01/28/18 1626 95 %     Weight 01/28/18 1627 205 lb (93 kg)     Height 01/28/18 1627 5\' 7"  (1.702 m)     Head Circumference --      Peak Flow --      Pain Score 01/28/18 1627 10     Pain Loc --      Pain  Edu? --      Excl. in GC? --     Constitutional: Alert and oriented. Acutely ill appearing and in no acute distress. Eyes: Conjunctivae are normal. Head: Atraumatic. Nose: No congestion/rhinnorhea. Mouth/Throat: Mucous membranes are moist.  Oropharynx non-erythematous. Neck: No stridor.   Cardiovascular: Tachycardic, regular rhythm. Grossly normal heart sounds.  Good peripheral circulation. Respiratory: Normal respiratory effort.  No retractions. Lungs CTAB. Gastrointestinal: Soft and nontender. No distention. No abdominal bruits. No CVA  tenderness. Musculoskeletal: No lower extremity tenderness nor edema.  No joint effusions. Neurologic:  Normal speech and language. No gross focal neurologic deficits are appreciated. No gait instability. Skin:  Skin is warm, dry and intact. No rash noted. Psychiatric: Mood and affect are normal. Speech and behavior are normal.  ____________________________________________   LABS (all labs ordered are listed, but only abnormal results are displayed)  Labs Reviewed  GROUP A STREP BY PCR - Abnormal; Notable for the following components:      Result Value   Group A Strep by PCR DETECTED (*)    All other components within normal limits  INFLUENZA PANEL BY PCR (TYPE A & B) - Abnormal; Notable for the following components:   Influenza B By PCR POSITIVE (*)    All other components within normal limits  COMPREHENSIVE METABOLIC PANEL - Abnormal; Notable for the following components:   Glucose, Bld 103 (*)    Creatinine, Ser 1.36 (*)    Calcium 8.7 (*)    ALT 49 (*)    All other components within normal limits  URINALYSIS, COMPLETE (UACMP) WITH MICROSCOPIC - Abnormal; Notable for the following components:   Color, Urine YELLOW (*)    APPearance CLEAR (*)    Ketones, ur 5 (*)    All other components within normal limits  CBC WITH DIFFERENTIAL/PLATELET   ____________________________________________  EKG  Not indicated. ____________________________________________  RADIOLOGY  ED MD interpretation:  Not indicated.  Official radiology report(s): No results found.  ____________________________________________   PROCEDURES  Procedure(s) performed: None  Procedures  Critical Care performed: No  ____________________________________________   INITIAL IMPRESSION / ASSESSMENT AND PLAN / ED COURSE  As part of my medical decision making, I reviewed the following data within the electronic MEDICAL RECORD NUMBER Notes from prior ED visits and  Controlled Substance  Database   33 year old male presents to the emergency department for flulike illness.  His blood pressure is a little lower than what it has been historically and he is febrile and slightly tachycardic.  He will receive 1 L of normal saline and influenza and strep testing will be completed.  He has been exposed to both.  After the normal saline infused, blood pressure is now normal and heart rate has started to reduce.  Temperature is also down and patient feels some better.  He tests positive for influenza B as well as strep throat.  He will be treated with amoxicillin, prednisone, and Norco.  He had also complained of back pain but I feel like this is part of influenza and not a specific of back issue.  He has had no injury no previous back pain similar to this.  Urinalysis is negative for infection.  He was advised that he will need to be out of work for the next 3 days or so or until he has been fever free for 24 hours.  Work excuse will be provided.  He was advised to return to the emergency department or see his primary care provider if not  improving over the next few days.      ____________________________________________   FINAL CLINICAL IMPRESSION(S) / ED DIAGNOSES  Final diagnoses:  Influenza B  Strep throat     ED Discharge Orders         Ordered    amoxicillin (AMOXIL) 500 MG tablet  3 times daily,   Status:  Discontinued     01/28/18 2018    HYDROcodone-acetaminophen (NORCO/VICODIN) 5-325 MG tablet  Every 6 hours PRN,   Status:  Discontinued     01/28/18 2018    predniSONE (STERAPRED UNI-PAK 21 TAB) 10 MG (21) TBPK tablet  Status:  Discontinued     01/28/18 2018    amoxicillin (AMOXIL) 500 MG tablet  3 times daily     01/28/18 2102    HYDROcodone-acetaminophen (NORCO/VICODIN) 5-325 MG tablet  Every 6 hours PRN     01/28/18 2102    predniSONE (STERAPRED UNI-PAK 21 TAB) 10 MG (21) TBPK tablet     01/28/18 2102           Note:  This document was prepared using  Dragon voice recognition software and may include unintentional dictation errors.    Chinita Pesterriplett, Hilja Kintzel B, FNP 01/29/18 0022    Arnaldo NatalMalinda, Paul F, MD 01/29/18 [phone]

## 2018-07-14 ENCOUNTER — Encounter: Payer: Self-pay | Admitting: Emergency Medicine

## 2018-07-14 ENCOUNTER — Other Ambulatory Visit: Payer: Self-pay

## 2018-07-14 ENCOUNTER — Emergency Department
Admission: EM | Admit: 2018-07-14 | Discharge: 2018-07-14 | Disposition: A | Discharge status: Home / Self Care | Payer: BC Managed Care – PPO | Attending: Emergency Medicine | Admitting: Emergency Medicine

## 2018-07-14 DIAGNOSIS — Z91010 Allergy to peanuts: Secondary | ICD-10-CM | POA: Insufficient documentation

## 2018-07-14 DIAGNOSIS — B349 Viral infection, unspecified: Secondary | ICD-10-CM | POA: Insufficient documentation

## 2018-07-14 DIAGNOSIS — Z20828 Contact with and (suspected) exposure to other viral communicable diseases: Secondary | ICD-10-CM | POA: Diagnosis not present

## 2018-07-14 DIAGNOSIS — J45909 Unspecified asthma, uncomplicated: Secondary | ICD-10-CM | POA: Insufficient documentation

## 2018-07-14 DIAGNOSIS — R509 Fever, unspecified: Secondary | ICD-10-CM | POA: Diagnosis present

## 2018-07-14 NOTE — ED Notes (Signed)
See triage note  States that he had low grade temp yesterday  But did not feel bad when he went to work yesterday  Was told that one of his co-worker was positive fo COVID

## 2018-07-14 NOTE — ED Provider Notes (Signed)
[facility] Emergency Department Provider Note  ____________________________________________  Time seen: Approximately 7:26 PM  I have reviewed the triage vital signs and the nursing notes.   HISTORY  Chief Complaint Fever    HPI Sean Wise is a 33 y.o. male presents to the emergency department with a low-grade fever that started yesterday.  Patient states that he is completely asymptomatic.  Denies headache, chest pain, chest tightness, shortness of breath, rhinorrhea, or body aches.  Patient does state that he has nasal congestion but this is consistent with seasonal allergies.  Fever has been as high as 100.9.  No other alleviating measures have been attempted.         Past Medical History:  Diagnosis Date  . Asthma     There are no active problems to display for this patient.   History reviewed. No pertinent surgical history.  Prior to Admission medications   Not on File    Allergies Chocolate, Peanut-containing drug products, and Tomato  No family history on file.  Social History Social History   Tobacco Use  . Smoking status: Never Smoker  . Smokeless tobacco: Never Used  Substance Use Topics  . Alcohol use: No  . Drug use: Never     Review of Systems  Constitutional: Patient has low grade fever.  Eyes: No visual changes. No discharge ENT: No upper respiratory complaints. Cardiovascular: no chest pain. Respiratory: no cough. No SOB. Gastrointestinal: No abdominal pain.  No nausea, no vomiting.  No diarrhea.  No constipation. Genitourinary: Negative for dysuria. No hematuria Musculoskeletal: Negative for musculoskeletal pain. Skin: Negative for rash, abrasions, lacerations, ecchymosis. Neurological: Negative for headaches, focal weakness or numbness.   ____________________________________________   PHYSICAL EXAM:  VITAL SIGNS: ED Triage Vitals  Enc Vitals Group     BP 07/14/18 1721 117/75     Pulse Rate 07/14/18  1721 (!) 121     Resp 07/14/18 1721 18     Temp 07/14/18 1721 99.4 F (37.4 C)     Temp Source 07/14/18 1721 Oral     SpO2 07/14/18 1721 98 %     Weight 07/14/18 1721 200 lb (90.7 kg)     Height 07/14/18 1721 5\' 7"  (1.702 m)     Head Circumference --      Peak Flow --      Pain Score 07/14/18 1724 0     Pain Loc --      Pain Edu? --      Excl. in GC? --      Constitutional: Alert and oriented. Well appearing and in no acute distress. Eyes: Conjunctivae are normal. PERRL. EOMI. Head: Atraumatic. ENT:      Nose: No congestion/rhinnorhea.      Mouth/Throat: Mucous membranes are moist.  Neck: No stridor.  No cervical spine tenderness to palpation. Hematological/Lymphatic/Immunilogical: No cervical lymphadenopathy. Cardiovascular: Normal rate, regular rhythm. Normal S1 and S2.  Good peripheral circulation. Respiratory: Normal respiratory effort without tachypnea or retractions. Lungs CTAB. Good air entry to the bases with no decreased or absent breath sounds. Gastrointestinal: Bowel sounds 4 quadrants. Soft and nontender to palpation. No guarding or rigidity. No palpable masses. No distention. No CVA tenderness. Musculoskeletal: Full range of motion to all extremities. No gross deformities appreciated. Neurologic:  Normal speech and language. No gross focal neurologic deficits are appreciated.  Skin:  Skin is warm, dry and intact. No rash noted. Psychiatric: Mood and affect are normal. Speech and behavior are normal. Patient exhibits appropriate insight  and judgement.   ____________________________________________   LABS (all labs ordered are listed, but only abnormal results are displayed)  Labs Reviewed  NOVEL CORONAVIRUS, NAA (HOSPITAL ORDER, SEND-OUT TO REF LAB)   ____________________________________________  EKG   ____________________________________________  RADIOLOGY   No results  found.  ____________________________________________    PROCEDURES  Procedure(s) performed:    Procedures    Medications - No data to display   ____________________________________________   INITIAL IMPRESSION / ASSESSMENT AND PLAN / ED COURSE  Pertinent labs & imaging results that were available during my care of the patient were reviewed by me and considered in my medical decision making (see chart for details).  Review of the [place] CSRS was performed in accordance of the [place] prior to dispensing any controlled drugs.           Assessment and plan Concern for COVID-58 33 year old male presents to the emergency department with low-grade fever that started yesterday.  Patient states that he has had nasal congestion but reports he is otherwise asymptomatic. Patient has low-grade fever in triage.  He has had no increased work of breathing and appears to be resting comfortably.  Physical exam is otherwise reassuring.  Patient underwent COVID-19 testing in the emergency department.  Patient was given no for work per his request.  All patient questions were answered.   ____________________________________________  FINAL CLINICAL IMPRESSION(S) / ED DIAGNOSES  Final diagnoses:  Viral illness      NEW MEDICATIONS STARTED DURING THIS VISIT:  ED Discharge Orders    None          This chart was dictated using voice recognition software/Dragon. Despite best efforts to proofread, errors can occur which can change the meaning. Any change was purely unintentional.    Lannie Fields, PA-C 07/14/18 Billie Lade, MD 07/15/18 [phone]

## 2018-07-14 NOTE — ED Triage Notes (Signed)
Pt presents to ED via POV with c/o fever at work yesterday of 100.9. Pt states has to be tested for Covid prior to being able to go back to work. Instructed on proper mask wearing while in triage room. Pt denies other symptoms at this time.

## 2018-07-14 NOTE — Discharge Instructions (Signed)
Quarantine at home for 7 days and at least 3 days after fever resolves if COVID-19 testing is positive.

## 2018-07-21 LAB — NOVEL CORONAVIRUS, NAA (HOSP ORDER, SEND-OUT TO REF LAB; TAT 18-24 HRS): SARS-CoV-2, NAA: NOT DETECTED

## 2018-12-10 IMAGING — CT CT NECK W/ CM
3 of 5 series · 13 of 33 positions shown, 16 images · IV contrast (iopamidol)
Comparison: None.

CLINICAL DATA: Right tonsillitis/ peritonsillar abscess.

EXAM:
CT NECK WITH CONTRAST
TECHNIQUE: Multidetector CT imaging of the neck was performed using the
standard protocol following the bolus administration of intravenous
contrast.
CONTRAST:  75mL [id number] IOPAMIDOL ([id number]-[id number]) INJECTION 61%

[Series 6: sag neck · sagittal · 0.46mm/px · 5 of 106 slices shown, 6 images]
[im 36/106  bone]
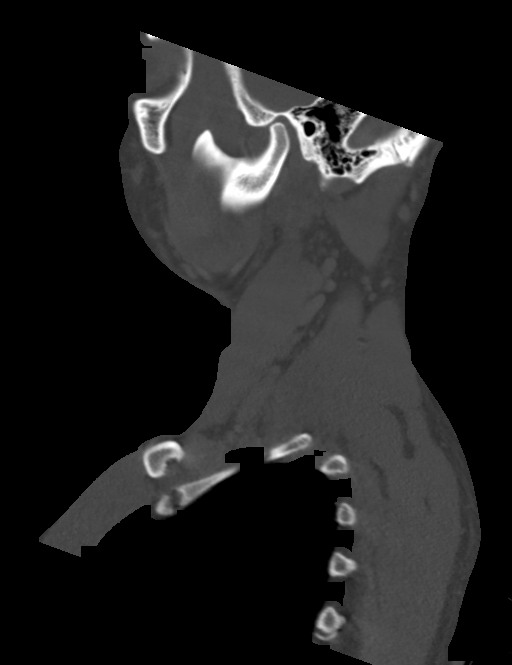
[im 44/106  bone]
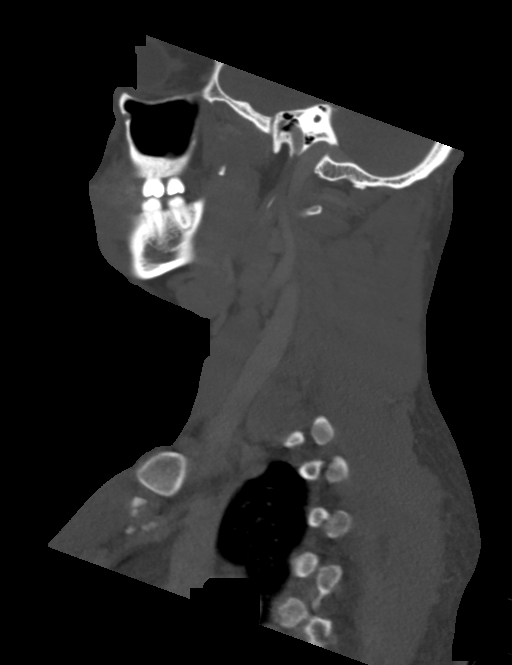
[im 53/106  soft-tissue]
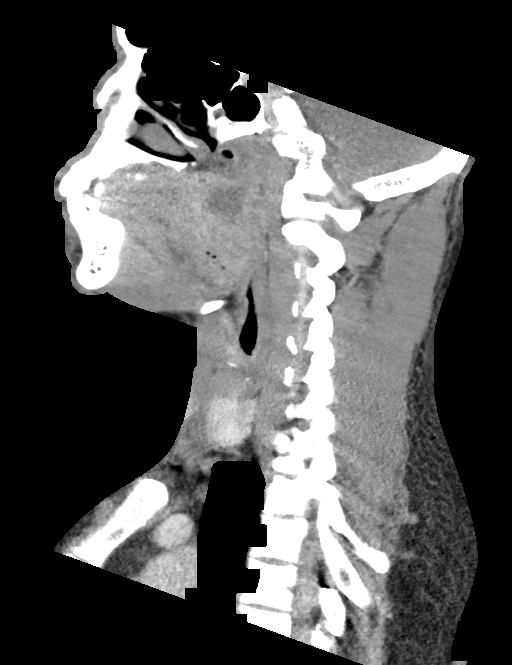
[im 53/106  bone]
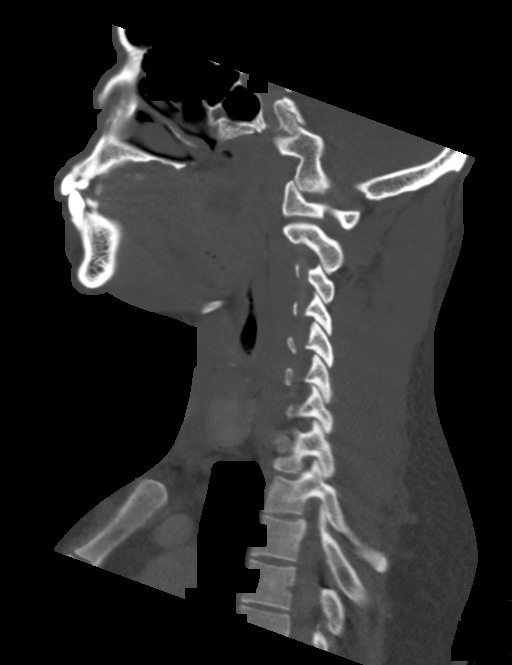
[im 62/106  bone]
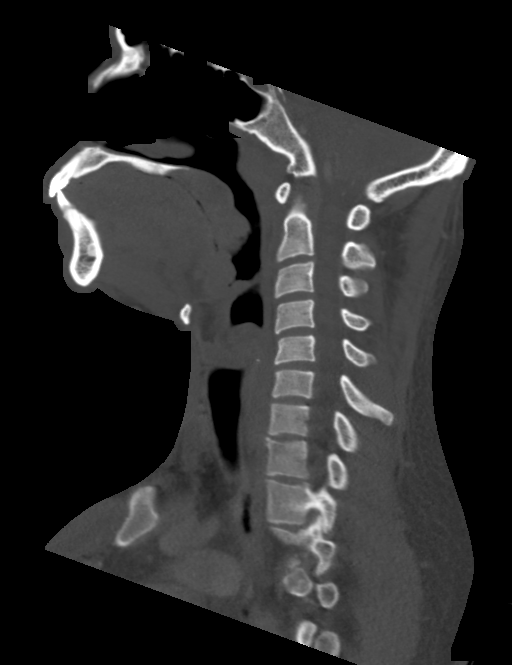
[im 71/106  bone]
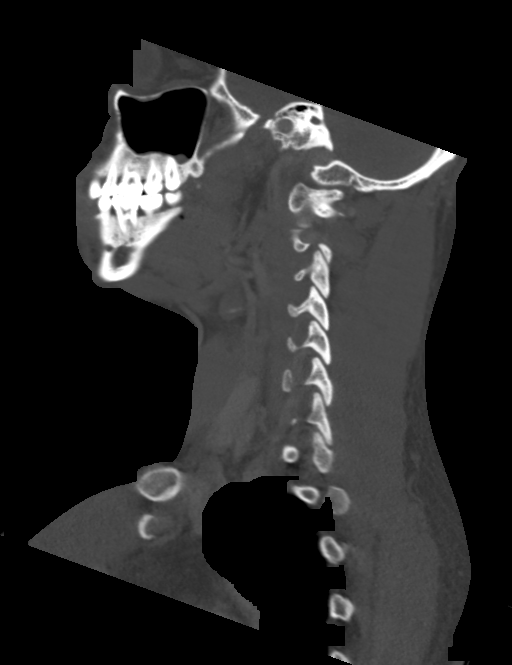

[Series 7: cor neck · coronal · 0.41mm/px · 3 of 82 slices shown]
[im 20/82  bone]
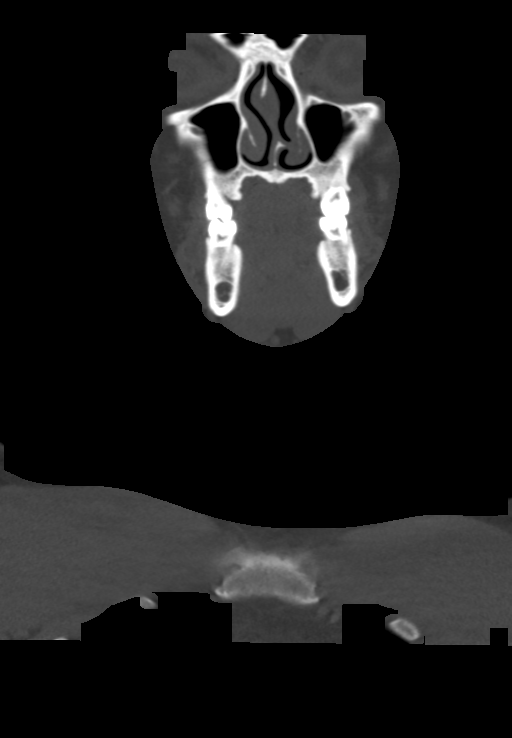
[im 34/82  bone]
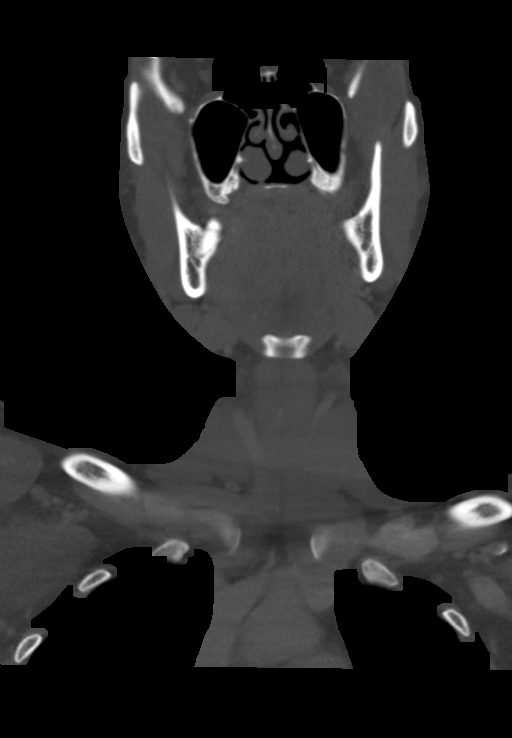
[im 48/82  bone]
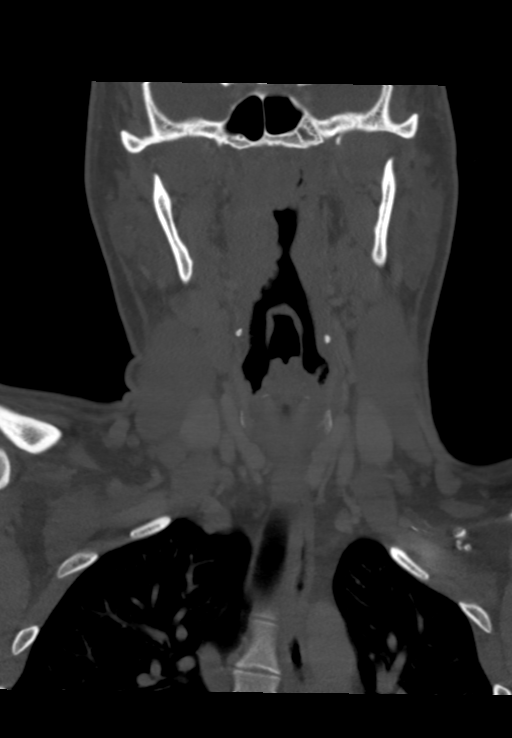

[Series 8: orthogonal ax · axial · 0.41mm/px · z∈[+297,+480]mm · 5 of 148 slices shown, 7 images]
[im 25/148  soft-tissue]
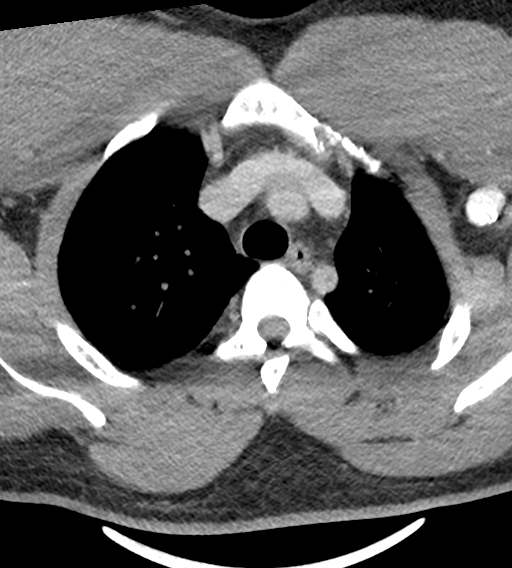
[im 25/148  bone]
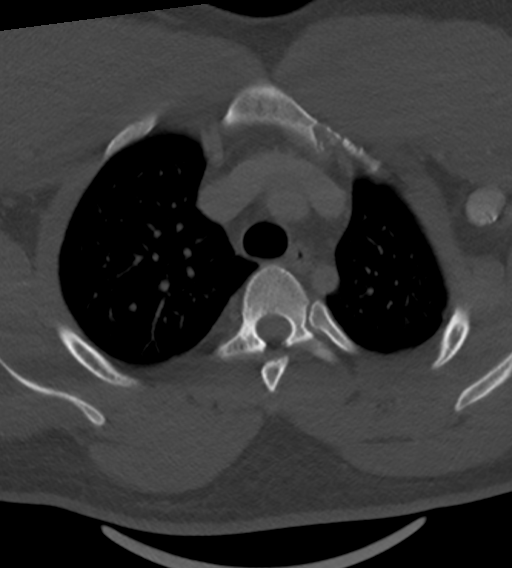
[im 50/148  bone]
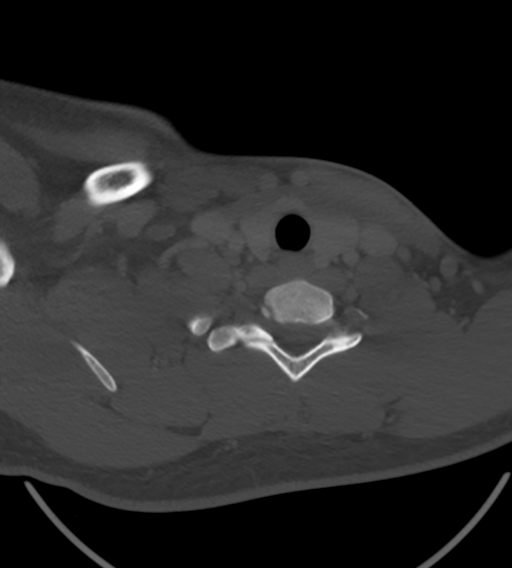
[im 74/148  bone]
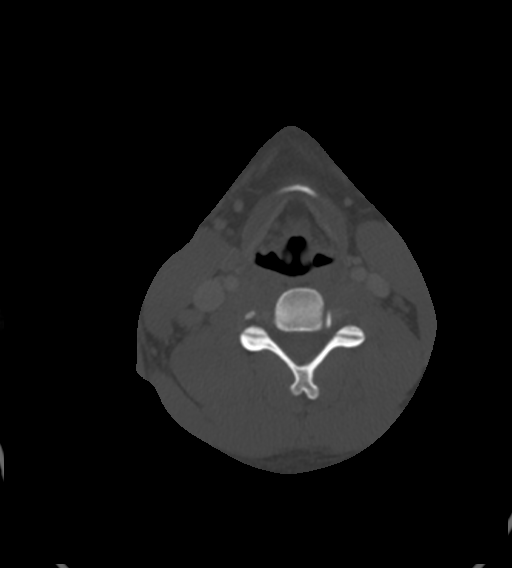
[im 99/148  bone]
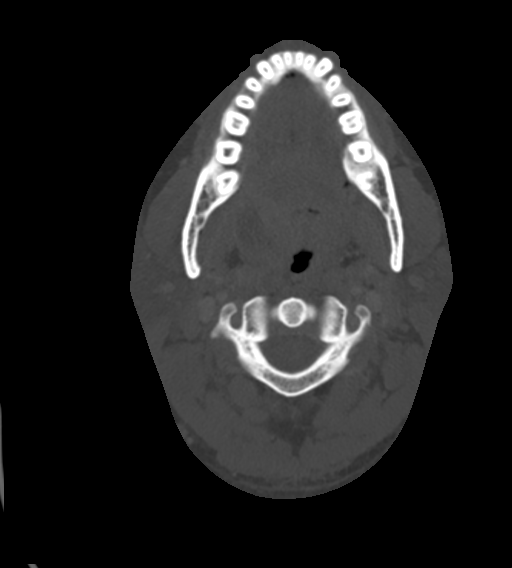
[im 123/148  soft-tissue]
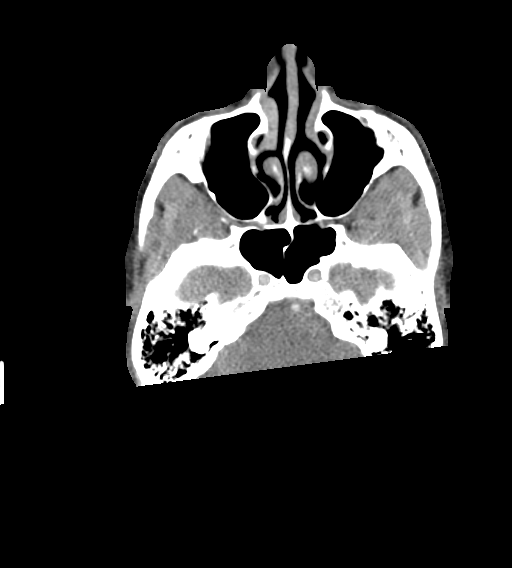
[im 123/148  bone]
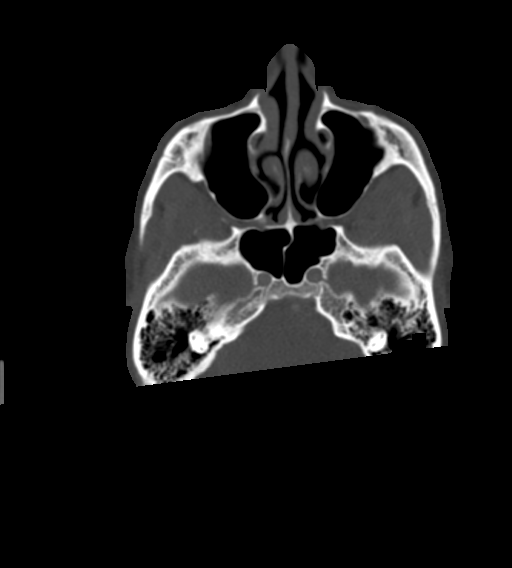

[13 of 33 positions shown; findings below may reference images not displayed]

FINDINGS: Pharynx and larynx: Fluid collection just anterior to the right
palatine tonsil measuring 18 x 14 mm. This is most consistent with
abscess given the history. There is diffuse enlargement of the
tonsils bilaterally. Adenoids also are enlarged bilaterally without
abscess. Larynx normal. Trachea normal. Airway intact.

Salivary glands: Parotid and submandibular glands normal
bilaterally.

Thyroid: Negative

Lymph nodes: Right level 2 lymph node 15 mm. Right level 3 lymph
node 10 mm. Left level 2 lymph nodes measuring 13 mm. Subcentimeter
posterior lymph nodes bilaterally.

Vascular: Negative

Limited intracranial: Negative

Visualized orbits: Negative

Mastoids and visualized paranasal sinuses: Negative

Skeleton: Negative

Upper chest: Negative

Other: None
IMPRESSION: Right peritonsillar fluid collection measuring 18 x 14 mm compatible
with abscess. Reactive adenopathy in the cervical lymph nodes.

## 2019-06-09 ENCOUNTER — Emergency Department
Admission: EM | Admit: 2019-06-09 | Discharge: 2019-06-09 | Disposition: A | Discharge status: Home / Self Care | Payer: BC Managed Care – PPO | Attending: Emergency Medicine | Admitting: Emergency Medicine

## 2019-06-09 ENCOUNTER — Other Ambulatory Visit: Payer: Self-pay

## 2019-06-09 ENCOUNTER — Encounter: Payer: Self-pay | Admitting: Emergency Medicine

## 2019-06-09 DIAGNOSIS — J45909 Unspecified asthma, uncomplicated: Secondary | ICD-10-CM | POA: Insufficient documentation

## 2019-06-09 DIAGNOSIS — N489 Disorder of penis, unspecified: Secondary | ICD-10-CM | POA: Insufficient documentation

## 2019-06-09 LAB — CBC WITH DIFFERENTIAL/PLATELET
Abs Immature Granulocytes: 0.05 10*3/uL (ref 0.00–0.07)
Basophils Absolute: 0.1 10*3/uL (ref 0.0–0.1)
Basophils Relative: 1 %
Eosinophils Absolute: 0.7 10*3/uL — ABNORMAL HIGH (ref 0.0–0.5)
Eosinophils Relative: 10 %
HCT: 42.9 % (ref 39.0–52.0)
Hemoglobin: 14.4 g/dL (ref 13.0–17.0)
Immature Granulocytes: 1 %
Lymphocytes Relative: 42 %
Lymphs Abs: 3.1 10*3/uL (ref 0.7–4.0)
MCH: 30.8 pg (ref 26.0–34.0)
MCHC: 33.6 g/dL (ref 30.0–36.0)
MCV: 91.9 fL (ref 80.0–100.0)
Monocytes Absolute: 0.6 10*3/uL (ref 0.1–1.0)
Monocytes Relative: 8 %
Neutro Abs: 2.8 10*3/uL (ref 1.7–7.7)
Neutrophils Relative %: 38 %
Platelets: 241 10*3/uL (ref 150–400)
RBC: 4.67 MIL/uL (ref 4.22–5.81)
RDW: 12.2 % (ref 11.5–15.5)
WBC: 7.4 10*3/uL (ref 4.0–10.5)
nRBC: 0 % (ref 0.0–0.2)

## 2019-06-09 LAB — CHLAMYDIA/NGC RT PCR (ARMC ONLY)
Chlamydia Tr: NOT DETECTED
N gonorrhoeae: NOT DETECTED

## 2019-06-09 LAB — URINALYSIS, COMPLETE (UACMP) WITH MICROSCOPIC
Bilirubin Urine: NEGATIVE
Glucose, UA: NEGATIVE mg/dL
Hgb urine dipstick: NEGATIVE
Ketones, ur: NEGATIVE mg/dL
Leukocytes,Ua: NEGATIVE
Nitrite: NEGATIVE
Protein, ur: NEGATIVE mg/dL
Specific Gravity, Urine: 1.023 (ref 1.005–1.030)
Squamous Epithelial / HPF: NONE SEEN (ref 0–5)
pH: 6 (ref 5.0–8.0)

## 2019-06-09 LAB — BASIC METABOLIC PANEL
Anion gap: 8 (ref 5–15)
BUN: 13 mg/dL (ref 6–20)
CO2: 25 mmol/L (ref 22–32)
Calcium: 9.1 mg/dL (ref 8.9–10.3)
Chloride: 101 mmol/L (ref 98–111)
Creatinine, Ser: 0.99 mg/dL (ref 0.61–1.24)
GFR calc Af Amer: >60 mL/min (ref >60)
GFR calc non Af Amer: >60 mL/min (ref >60)
Glucose, Bld: 86 mg/dL (ref 70–99)
Potassium: 3.7 mmol/L (ref 3.5–5.1)
Sodium: 134 mmol/L — ABNORMAL LOW (ref 135–145)

## 2019-06-09 MED ORDER — DOXYCYCLINE HYCLATE 100 MG PO CAPS: 100.0000 mg | ORAL_CAPSULE | Freq: Two times a day (BID) | ORAL | 0 refills | Status: DC

## 2019-06-09 MED ORDER — CEFTRIAXONE SODIUM 1 G IJ SOLR
500.0000 mg | Freq: Once | INTRAMUSCULAR | Status: AC
  Administered 2019-06-09: 500 mg via INTRAMUSCULAR
  Filled 2019-06-09: qty 10

## 2019-06-09 MED ORDER — LIDOCAINE HCL (PF) 1 % IJ SOLN
5.0000 mL | Freq: Once | INTRAMUSCULAR | Status: AC
  Administered 2019-06-09: 5 mL
  Filled 2019-06-09: qty 5

## 2019-06-09 NOTE — Discharge Instructions (Addendum)
You will need to follow-up with [facility] department if any of your test result positive.  A panel of sexually transmitted diseases was tested for today and the results has not been received prior to your discharge.  Your urinalysis and blood work so far is negative.  The area on your penis is concerning for an STD.  The doxycycline was sent to the pharmacy is to be taken twice a day for the next 7 days.  You also received an injection of Rocephin while in the ED.  It may be necessary for further medication.  Your test for diabetes showed that your blood sugar was within normal limits.  Until these results have been received practice safe sex.  The chlamydia and gonorrhea test was negative however the test for syphilis still remains without any results.

## 2019-06-09 NOTE — ED Provider Notes (Addendum)
[facility] Emergency Department Provider Note   ____________________________________________   First MD Initiated Contact with Patient 06/09/19 1609     (approximate)  I have reviewed the triage vital signs and the nursing notes.   HISTORY  Chief Complaint Abscess   HPI Sean Wise is a 34 y.o. male presents to the ED with an open wound to the shaft of his penis.  Patient states that he noticed a bump approximately 2 weeks ago.  He denies any penile discharge.  He admits that prior to the development of this area he did have unprotected oral sex.  He denies any previous problems and reports that he used a pair of splinter remover's from work to open the area which was mostly blood with minimal pus.  Patient denies any fever or chills.  He denies any urinary symptoms or dysuria.  Patient states that he developed a scab in this area and one morning this area while in the shower fell off but has not healed since.  He denies any pain.     Past Medical History:  Diagnosis Date  . Asthma     There are no problems to display for this patient.   History reviewed. No pertinent surgical history.  Prior to Admission medications   Medication Sig Start Date End Date Taking? Authorizing Provider  doxycycline (VIBRAMYCIN) 100 MG capsule Take 1 capsule (100 mg total) by mouth 2 (two) times daily. 06/09/19   Tommi Rumps, PA-C    Allergies Chocolate, Peanut-containing drug products, and Tomato  No family history on file.  Social History Social History   Tobacco Use  . Smoking status: Never Smoker  . Smokeless tobacco: Never Used  Substance Use Topics  . Alcohol use: No  . Drug use: Never    Review of Systems Constitutional: No fever/chills Eyes: No visual changes. ENT: No sore throat. Cardiovascular: Denies chest pain. Respiratory: Denies shortness of breath. Gastrointestinal: No abdominal pain.  No nausea, no vomiting.  Genitourinary:  Negative for dysuria.  Penile lesion for 2 weeks. Musculoskeletal: Negative for back pain. Skin: Negative for rash. Neurological: Negative for headaches, focal weakness or numbness. ___________________________________________   PHYSICAL EXAM:  VITAL SIGNS: ED Triage Vitals [06/09/19 1517]  Enc Vitals Group     BP 138/78     Pulse Rate 78     Resp 18     Temp 98 F (36.7 C)     Temp Source Oral     SpO2 100 %     Weight 205 lb (93 kg)     Height 5\' 7"  (1.702 m)     Head Circumference      Peak Flow      Pain Score 0     Pain Loc      Pain Edu?      Excl. in GC?    Constitutional: Alert and oriented. Well appearing and in no acute distress. Eyes: Conjunctivae are normal.  Head: Atraumatic. Neck: No stridor.   Cardiovascular: Normal rate, regular rhythm. Grossly normal heart sounds.  Good peripheral circulation. Respiratory: Normal respiratory effort.  No retractions. Lungs CTAB. Genitourinary: Right lateral midshaft penis there is a single very shallow ulcerative-like area without drainage.  No penile discharge is noted. Musculoskeletal: Moves upper and lower extremities without any difficulty.  Normal gait was noted. Neurologic:  Normal speech and language. No gross focal neurologic deficits are appreciated. No gait instability. Skin:  Skin is warm, dry.  As noted above. Psychiatric:  Mood and affect are normal. Speech and behavior are normal.  ____________________________________________   LABS (all labs ordered are listed, but only abnormal results are displayed)  Labs Reviewed  CBC WITH DIFFERENTIAL/PLATELET - Abnormal; Notable for the following components:      Result Value   Eosinophils Absolute 0.7 (*)    All other components within normal limits  BASIC METABOLIC PANEL - Abnormal; Notable for the following components:   Sodium 134 (*)    All other components within normal limits  URINALYSIS, COMPLETE (UACMP) WITH MICROSCOPIC - Abnormal; Notable for the  following components:   Color, Urine YELLOW (*)    APPearance CLEAR (*)    Bacteria, UA RARE (*)    All other components within normal limits  CHLAMYDIA/NGC RT PCR ([facility] ONLY)  RPR    PROCEDURES  Procedure(s) performed (including Critical Care):  Procedures   ____________________________________________   INITIAL IMPRESSION / ASSESSMENT AND PLAN / ED COURSE  As part of my medical decision making, I reviewed the following data within the electronic MEDICAL RECORD NUMBER Notes from prior ED visits and [place] Controlled Substance Database  34 year old male presents to the ED with a history of what sounds like a pimple that started out on the shaft of his penis 2 weeks ago.  Patient states that he popped it with a pair of splinter tweezers that he got from work.  Patient states that it bled but very little pus.  Since that time he has had a scab on the area which fell off while he was showering.  He states the area has been slow to heal.  He denies any known history of diabetes but states that it is in the family.  He denies any fever, chills, nausea or vomiting.  Area of concern is one single shallow ulcerative lesion on the right lateral mid shaft.  No penile discharge was noted.  GC and Chlamydia test were negative.  RPR was still pending at the time of patient's discharge.  Patient is aware that he will be called if this test is positive.  Reviewing the CDC guidelines it is still recommended that Rocephin and doxycycline be used.  He  received an injection of Rocephin 500 mg IM and doxycycline.  He is to follow-up at the health department and was given the name of the urologist on call if he continues to have problems.  He may return to the emergency department if any severe worsening of his symptoms or urgent concerns.  ____________________________________________   FINAL CLINICAL IMPRESSION(S) / ED DIAGNOSES  Final diagnoses:  Lesion of penis     ED Discharge Orders          Ordered    doxycycline (VIBRAMYCIN) 100 MG capsule  2 times daily,   Status:  Discontinued     06/09/19 1832    doxycycline (VIBRAMYCIN) 100 MG capsule  2 times daily     06/09/19 1833           Note:  This document was prepared using Dragon voice recognition software and may include unintentional dictation errors.    Johnn Hai, PA-C 06/09/19 1848    Johnn Hai, PA-C 06/09/19 Lesle Chris, MD 06/09/19 2139

## 2019-06-09 NOTE — ED Triage Notes (Signed)
States he noticed a "knot" on the shaft of his penis about 2 weeks ago  States he has attempted to "pop" the area  Now states area is swollen

## 2019-06-10 LAB — RPR
RPR Ser Ql: REACTIVE — AB
RPR Titer: 1:8 {titer}

## 2019-06-13 ENCOUNTER — Telehealth: Payer: Self-pay | Admitting: Emergency Medicine

## 2019-06-13 LAB — T.PALLIDUM AB, TOTAL: T Pallidum Abs: REACTIVE — AB

## 2019-06-13 NOTE — Telephone Encounter (Signed)
Called patient to give std results for rpr.  Left message asking him to call me.

## 2019-06-13 NOTE — Telephone Encounter (Signed)
Patient called me back.  He is aware of the results in mychart.  expained that the results indicate he has syphilis.  I told him he needs to follow up at health department and he says he has informed his partner.

## 2019-06-14 ENCOUNTER — Telehealth (HOSPITAL_COMMUNITY): Payer: Self-pay

## 2019-06-19 ENCOUNTER — Encounter: Payer: Self-pay | Admitting: Family Medicine

## 2019-06-19 ENCOUNTER — Ambulatory Visit: Payer: Self-pay | Admitting: Family Medicine

## 2019-06-19 ENCOUNTER — Other Ambulatory Visit: Payer: Self-pay

## 2019-06-19 DIAGNOSIS — J45909 Unspecified asthma, uncomplicated: Secondary | ICD-10-CM | POA: Insufficient documentation

## 2019-06-19 DIAGNOSIS — L309 Dermatitis, unspecified: Secondary | ICD-10-CM | POA: Insufficient documentation

## 2019-06-19 DIAGNOSIS — A510 Primary genital syphilis: Secondary | ICD-10-CM

## 2019-06-19 MED ORDER — PENICILLIN G BENZATHINE 2400000 UNIT/4ML IM SUSP
2.4000 10*6.[IU] | Freq: Once | INTRAMUSCULAR | Status: AC
  Administered 2019-06-19: 2400000 [IU] via INTRAMUSCULAR

## 2019-06-19 NOTE — Progress Notes (Signed)
[facility] Department STI clinic/screening visit  Subjective:  Sean Wise is a 34 y.o. male being seen today for No chief complaint on file.    The patient reports they do have symptoms.   Patient has the following medical conditions:   Patient Active Problem List   Diagnosis Date Noted  . Asthma 06/19/2019  . Eczema 06/19/2019  . 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 Was seen in ER 1 wk ago (06/09/19) d/t lesion on penis. He was presumptively given rocephin 500mg  IM and doxycycline 100mg  BID x1 wk from the ER, which he completed. RPR returned from that visit reactive at 1:8, t.palladium ab reactive. He has no recent syphilis testing on record. He states his penis lesion is still present, nontender. When asked about rashes he endorses chronic severe eczema, worse at flexures but also with scattered itchy lesions on body. He states this is a chronic condition, unchanged recently. When asked about vision changes he endorses chronic vision problems related to needing to change contact lenses, having difficulty w/insurance to do this.   Any history of syphilis dx/tx in the past? no Any record of negative RPR in past year? no Any contacts positive for syphilis? no   See flowsheet for further details and programmatic requirements.   Last HIV screening: 11/2015   No components found for: HCV  The following portions of the patient's history were reviewed and updated as appropriate: allergies, current medications, past medical history, past social history, past surgical history and problem list.  Objective:  There were no vitals filed for this visit.   Physical Exam Constitutional:      Appearance: Normal appearance.  HENT:     Head: Normocephalic and atraumatic.     Comments: No nits or hair loss    Mouth/Throat:     Mouth: Mucous membranes are moist.     Pharynx: Oropharynx is clear. No oropharyngeal exudate or  posterior oropharyngeal erythema.  Pulmonary:     Effort: Pulmonary effort is normal.  Abdominal:     General: Abdomen is flat.     Palpations: Abdomen is soft. There is no hepatomegaly or mass.     Tenderness: There is no abdominal tenderness.  Genitourinary:    Pubic Area: No rash or pubic lice.      Penis: Circumcised. Lesions present. No erythema, tenderness or discharge.      Testes: Normal.     Epididymis:     Right: Normal.     Left: Normal.     Rectum: Normal.    Lymphadenopathy:     Head:     Right side of head: No preauricular or posterior auricular adenopathy.     Left side of head: No preauricular or posterior auricular adenopathy.     Cervical: No cervical adenopathy.     Upper Body:     Right upper body: No supraclavicular or axillary adenopathy.     Left upper body: No supraclavicular or axillary adenopathy.     Lower Body: No right inguinal adenopathy. No left inguinal adenopathy.  Skin:    General: Skin is warm and dry.     Findings: Lesion (scattered scaly, erythematous, oozing lesions with large patches in elbow flexures) present.  Neurological:     Mental Status: He is alert and oriented to person, place, and time.       Assessment and Plan:  Sean Wise is a 34 y.o. male presenting to the  [facility] Department for STI screening    1. Primary syphilis -As pt has chancre will tx as primary stage. Treat for primary syphilis w/Penicillin G 2.4 million IM x 1. -Pt informed to RTC for RPR titers at 6 and 12 months to be sure decreased. -Pt's information is known by [facility], pt has been contacted. -Discussed possible Jarisch-Herxheimer reaction of HA, myalgias, rigors, diaphoresis, hypotension, and worsening of rash if initially present. Uncommon manifestations include meningitis, respiratory distress, renal and/or hepatic dysfunction, mental status changes, stroke, seizures, and uterine contractions in pregnancy. Advised to seek medical care if  worrisome symptoms present.  -Pt advised no sex x2 wks and that partner(s) needs treatment. Encouraged condoms with all sex. - HIV [place] [facility] - Penicillin G Benzathine SUSP 2,400,000 Units    Return in about 6 months (around 12/19/2019) for for RPR bloodwork.  No future appointments.  Ann Held, PA-C

## 2019-06-19 NOTE — Progress Notes (Signed)
Pt treated per provider orders. Provider orders completed. 

## 2019-07-11 ENCOUNTER — Ambulatory Visit: Payer: BC Managed Care – PPO

## 2019-07-18 ENCOUNTER — Ambulatory Visit: Payer: BC Managed Care – PPO

## 2019-10-01 ENCOUNTER — Other Ambulatory Visit: Payer: Self-pay

## 2019-10-01 ENCOUNTER — Emergency Department
Admission: EM | Admit: 2019-10-01 | Discharge: 2019-10-02 | Disposition: A | Discharge status: Home / Self Care | Payer: BC Managed Care – PPO | Attending: Emergency Medicine | Admitting: Emergency Medicine

## 2019-10-01 DIAGNOSIS — J45909 Unspecified asthma, uncomplicated: Secondary | ICD-10-CM | POA: Diagnosis not present

## 2019-10-01 DIAGNOSIS — M5441 Lumbago with sciatica, right side: Secondary | ICD-10-CM | POA: Diagnosis not present

## 2019-10-01 MED ORDER — METHOCARBAMOL 500 MG PO TABS: 500.0000 mg | ORAL_TABLET | Freq: Three times a day (TID) | ORAL | 0 refills | Status: AC | PRN

## 2019-10-01 MED ORDER — KETOROLAC TROMETHAMINE 30 MG/ML IJ SOLN
30.0000 mg | Freq: Once | INTRAMUSCULAR | Status: AC
  Administered 2019-10-01: 30 mg via INTRAMUSCULAR
  Filled 2019-10-01: qty 1

## 2019-10-01 MED ORDER — PREDNISONE 10 MG (21) PO TBPK: ORAL_TABLET | ORAL | 0 refills | Status: AC

## 2019-10-01 NOTE — ED Provider Notes (Signed)
Emergency Department Provider Note  ____________________________________________  Time seen: Approximately 11:20 PM  I have reviewed the triage vital signs and the nursing notes.   HISTORY  Chief Complaint Back Pain   Historian Patient     HPI Sean Wise is a 34 y.o. male presents to the emergency department with low back pain that is occurred intermittently for the past 2 to 3 weeks.  Patient states the pain seems to radiate into his buttocks.  He states the pain feels sharp.  No bowel or bladder incontinence or saddle anesthesia.  Patient states that he has experienced similar symptoms in the past which resolved after rest.  He has been taking meloxicam at home with little relief.  No dysuria or hematuria.  No other alleviating measures have been attempted.   Past Medical History:  Diagnosis Date  . Asthma      Immunizations up to date:  Yes.     Past Medical History:  Diagnosis Date  . Asthma     Patient Active Problem List   Diagnosis Date Noted  . Asthma 06/19/2019  . Eczema 06/19/2019  . 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  Prior to Admission medications   Medication Sig Start Date End Date Taking? Authorizing Provider  methocarbamol (ROBAXIN) 500 MG tablet Take 1 tablet (500 mg total) by mouth every 8 (eight) hours as needed for up to 5 days. 10/01/19 10/06/19  Orvil Feil, PA-C  predniSONE (STERAPRED UNI-PAK 21 TAB) 10 MG (21) TBPK tablet Take 6 tablets the first day, take 5 tablets the second day, take 4 tablets the third day, take 3 tablets the fourth day, take 2 tablets the fifth day, take 1 tablet the sixth day. 10/01/19   Orvil Feil, PA-C    Allergies Chocolate, Peanut-containing drug products, and Tomato  No family history on file.  Social History Social History   Tobacco Use  . Smoking status: Never Smoker  . Smokeless tobacco: Never Used  Vaping Use  . Vaping Use: Never used  Substance Use Topics   . Alcohol use: No  . Drug use: Never     Review of Systems  Constitutional: No fever/chills Eyes:  No discharge ENT: No upper respiratory complaints. Respiratory: no cough. No SOB/ use of accessory muscles to breath Gastrointestinal:   No nausea, no vomiting.  No diarrhea.  No constipation. Musculoskeletal: Patient has low back pain.  Skin: Negative for rash, abrasions, lacerations, ecchymosis.    ____________________________________________   PHYSICAL EXAM:  VITAL SIGNS: ED Triage Vitals  Enc Vitals Group     BP 10/01/19 2102 129/77     Pulse Rate 10/01/19 2102 84     Resp 10/01/19 2102 17     Temp 10/01/19 2102 98.4 F (36.9 C)     Temp Source 10/01/19 2102 Oral     SpO2 10/01/19 2102 95 %     Weight 10/01/19 2101 220 lb (99.8 kg)     Height 10/01/19 2101 5\' 7"  (1.702 m)     Head Circumference --      Peak Flow --      Pain Score 10/01/19 2101 10     Pain Loc --      Pain Edu? --      Excl. in GC? --      Constitutional: Alert and oriented. Well appearing and in no acute distress. Eyes: Conjunctivae are normal. PERRL. EOMI. Head: Atraumatic. Cardiovascular: Normal rate, regular rhythm. Normal S1  and S2.  Good peripheral circulation. Respiratory: Normal respiratory effort without tachypnea or retractions. Lungs CTAB. Good air entry to the bases with no decreased or absent breath sounds Gastrointestinal: Bowel sounds x 4 quadrants. Soft and nontender to palpation. No guarding or rigidity. No distention. Musculoskeletal: Full range of motion to all extremities. No obvious deformities noted. Positive straight leg raise test, right.  Neurologic:  Normal for age. No gross focal neurologic deficits are appreciated.  Skin:  Skin is warm, dry and intact. No rash noted. Psychiatric: Mood and affect are normal for age. Speech and behavior are normal.   ____________________________________________   LABS (all labs ordered are listed, but only abnormal results are  displayed)  Labs Reviewed - No data to display ____________________________________________  EKG   ____________________________________________  RADIOLOGY   No results found.  ____________________________________________    PROCEDURES  Procedure(s) performed:     Procedures     Medications  ketorolac (TORADOL) 30 MG/ML injection 30 mg (30 mg Intramuscular Given 10/01/19 2346)     ____________________________________________   INITIAL IMPRESSION / ASSESSMENT AND PLAN / ED COURSE  Pertinent labs & imaging results that were available during my care of the patient were reviewed by me and considered in my medical decision making (see chart for details).      Assessment and plan Low back pain 34 year old male presents to the emergency department with bilateral low back pain, right side worse than left that radiates into the buttocks.  Vital signs are reassuring at triage.  Patient had a positive straight leg raise test on the right.  Since patient has failed conservative measures with anti-inflammatories, I started him on tapered prednisone and Robaxin.  He was advised to follow-up with primary care if symptoms persist.  All patient questions were answered.    ____________________________________________  FINAL CLINICAL IMPRESSION(S) / ED DIAGNOSES  Final diagnoses:  Acute bilateral low back pain with right-sided sciatica      NEW MEDICATIONS STARTED DURING THIS VISIT:  ED Discharge Orders         Ordered    predniSONE (STERAPRED UNI-PAK 21 TAB) 10 MG (21) TBPK tablet        10/01/19 2323    methocarbamol (ROBAXIN) 500 MG tablet  Every 8 hours PRN        10/01/19 2323              This chart was dictated using voice recognition software/Dragon. Despite best efforts to proofread, errors can occur which can change the meaning. Any change was purely unintentional.     Orvil Feil, PA-C 10/02/19 0321    Delton Prairie, MD 10/06/19  [phone]

## 2019-10-01 NOTE — ED Triage Notes (Signed)
Patient reports back pain for 2 weeks.

## 2019-10-01 NOTE — ED Notes (Signed)
Pt is ambulatory to the room, back pain for the past 2 weeks, see triage notes.  This has happened before and went away after a couple of days, but this time it has not.

## 2019-10-02 NOTE — ED Notes (Signed)
Signature pad failed, discharge instructions were reviewed, pt verbalized understanding.

## 2020-07-24 IMAGING — DX DG CHEST 2V
2 series · 2 of 2 positions shown · non-contrast
Comparison: 03/31/2017.

CLINICAL DATA: Pre-employment exam.

EXAM:
CHEST - 2 VIEW

[dg chest 2 view (1 of 2)]
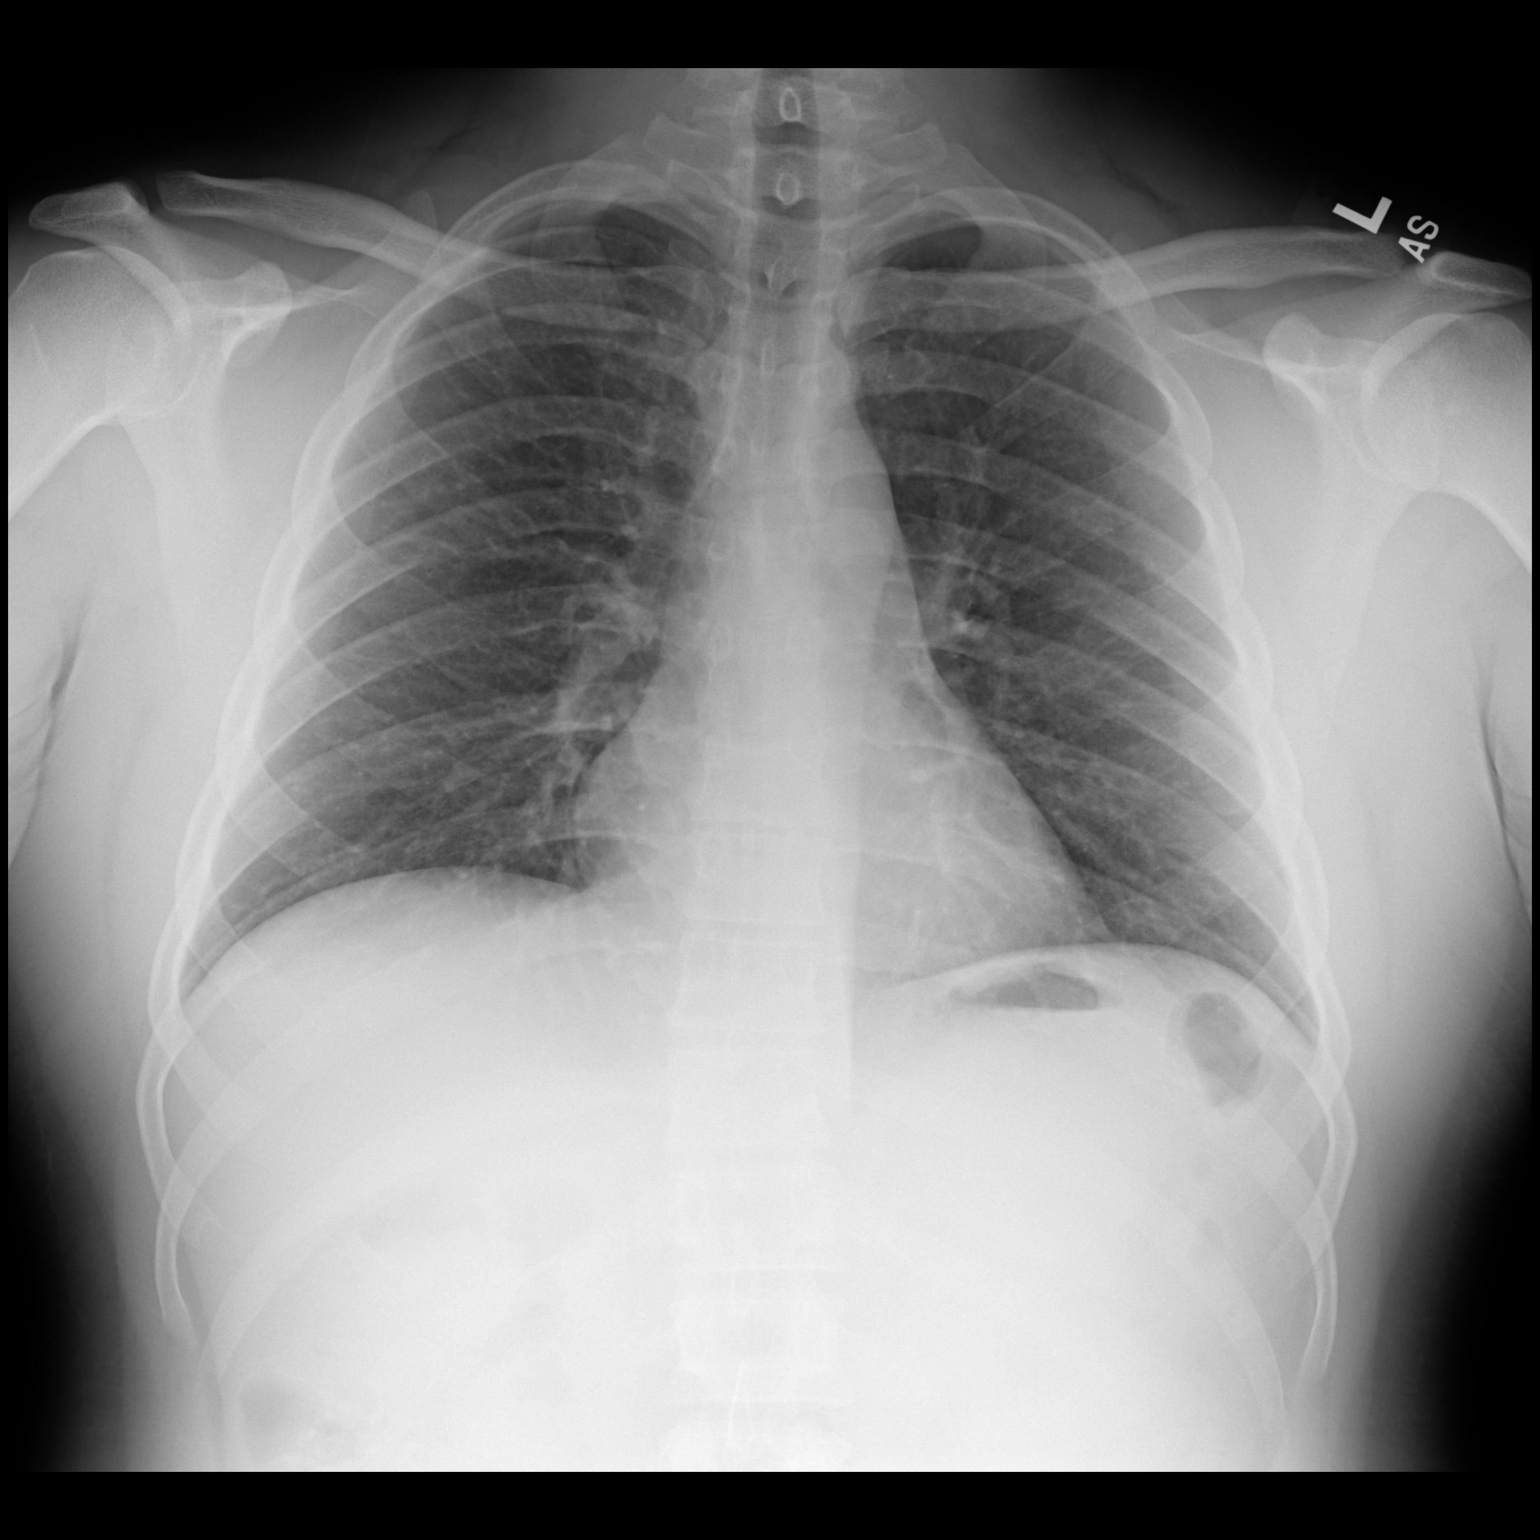

[dg chest 2 view (2 of 2)]
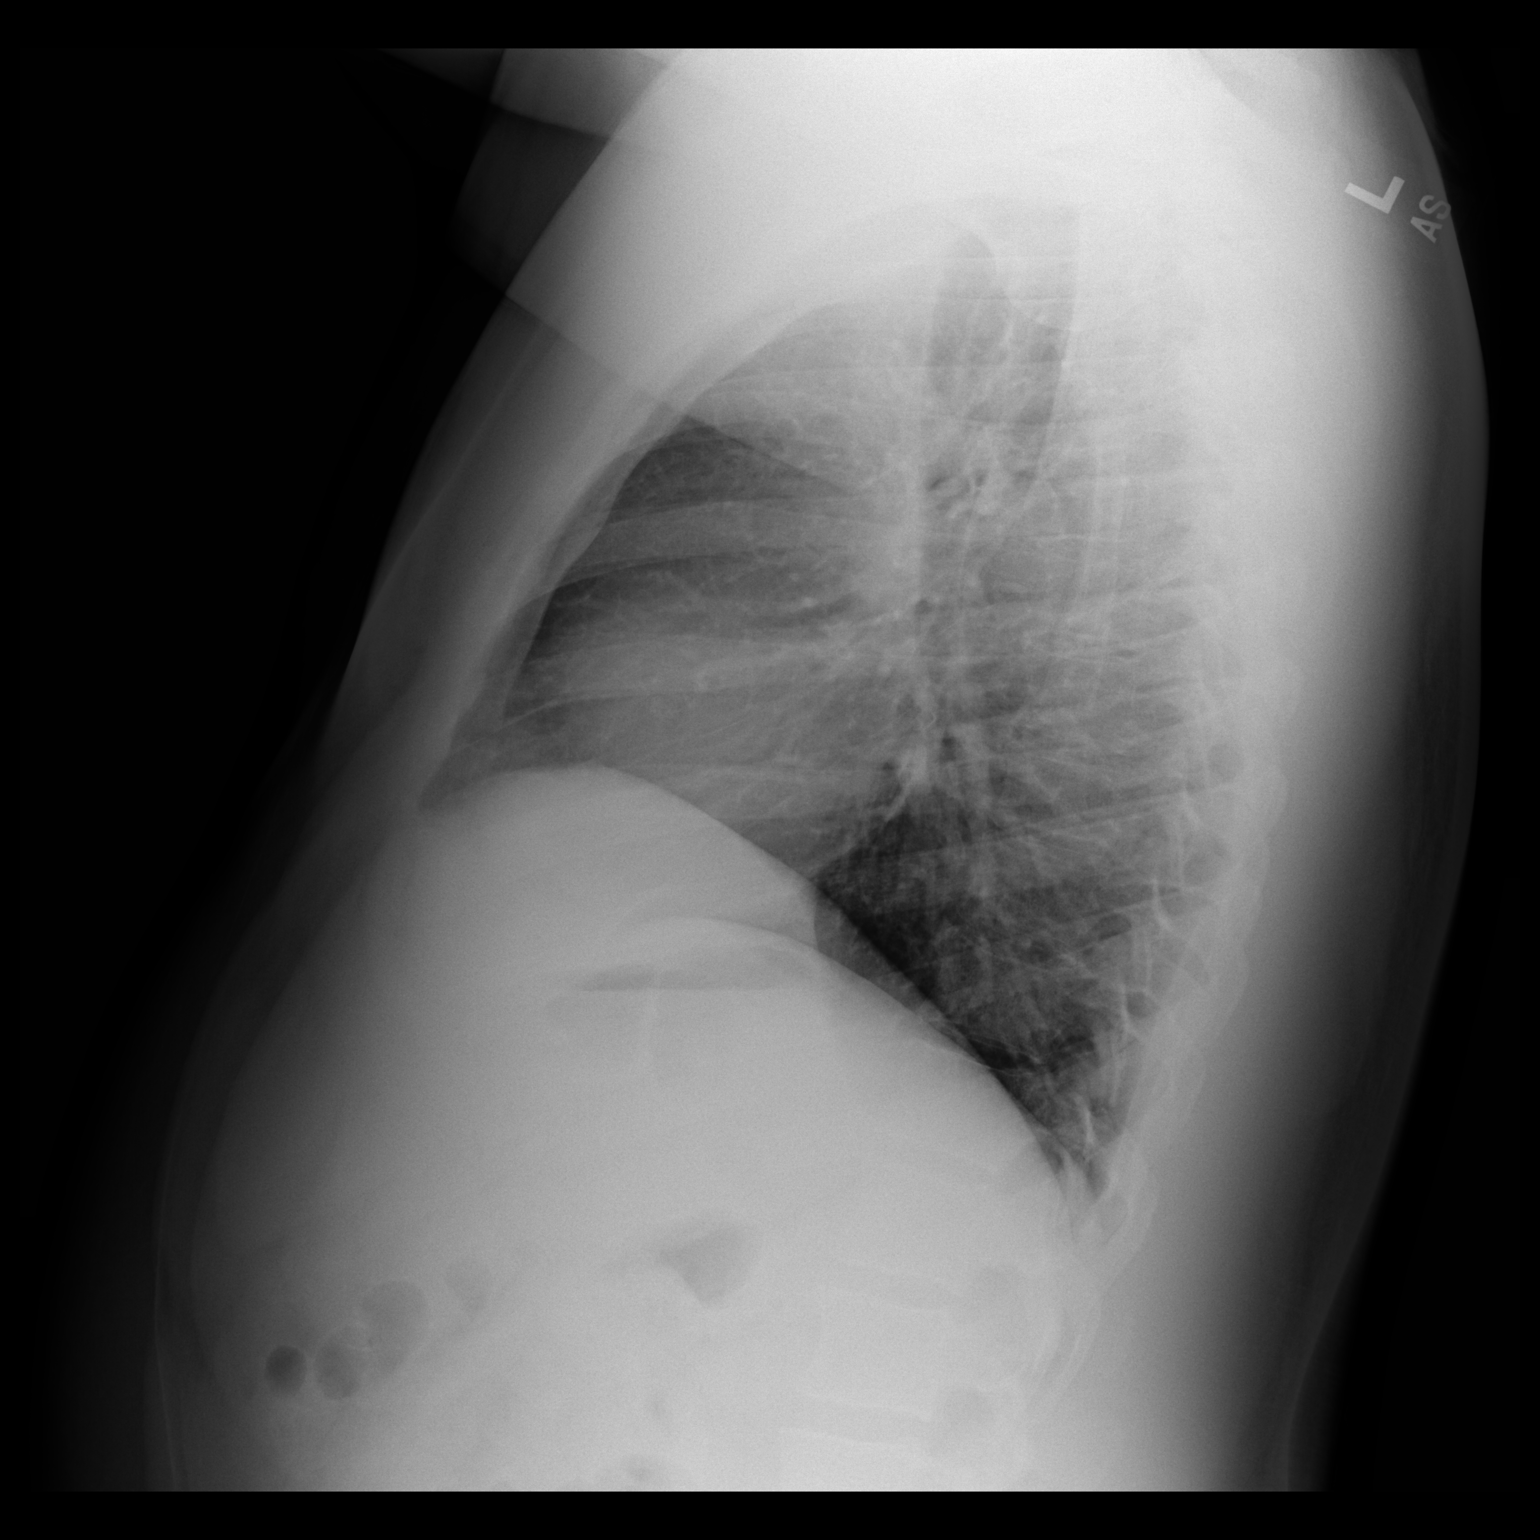

[2 of 2 positions shown; findings below may reference images not displayed]

FINDINGS: Mediastinum and hilar structures normal. Lungs are clear. No pleural
effusion or pneumothorax. Heart size normal. No acute bony
abnormality.
IMPRESSION: No acute cardiopulmonary disease.
